# Patient Record
Sex: Female | Born: 1940 | Race: White | Hispanic: No | Marital: Single | Smoking: Former smoker
Health system: Southern US, Community
[De-identification: ages and names within clinical notes are randomized; demographics above are authoritative.]

## PROBLEM LIST (undated history)

## (undated) DIAGNOSIS — M1990 Unspecified osteoarthritis, unspecified site: Secondary | ICD-10-CM

## (undated) DIAGNOSIS — Z8679 Personal history of other diseases of the circulatory system: Secondary | ICD-10-CM

## (undated) DIAGNOSIS — E0590 Thyrotoxicosis, unspecified without thyrotoxic crisis or storm: Secondary | ICD-10-CM

## (undated) DIAGNOSIS — E119 Type 2 diabetes mellitus without complications: Secondary | ICD-10-CM

## (undated) HISTORY — DX: Thyrotoxicosis, unspecified without thyrotoxic crisis or storm: E05.90

## (undated) HISTORY — PX: OTHER SURGICAL HISTORY: SHX169

## (undated) HISTORY — DX: Type 2 diabetes mellitus without complications: E11.9

## (undated) HISTORY — DX: Personal history of other diseases of the circulatory system: Z86.79

---

## 2005-03-21 DIAGNOSIS — C4491 Basal cell carcinoma of skin, unspecified: Secondary | ICD-10-CM

## 2005-03-21 HISTORY — DX: Basal cell carcinoma of skin, unspecified: C44.91

## 2011-12-18 ENCOUNTER — Encounter (INDEPENDENT_AMBULATORY_CARE_PROVIDER_SITE_OTHER): Payer: Self-pay | Admitting: Internal Medicine

## 2011-12-18 ENCOUNTER — Encounter (INDEPENDENT_AMBULATORY_CARE_PROVIDER_SITE_OTHER): Payer: Self-pay

## 2011-12-18 ENCOUNTER — Telehealth (INDEPENDENT_AMBULATORY_CARE_PROVIDER_SITE_OTHER): Payer: Self-pay | Admitting: *Deleted

## 2011-12-18 ENCOUNTER — Other Ambulatory Visit (INDEPENDENT_AMBULATORY_CARE_PROVIDER_SITE_OTHER): Payer: Self-pay | Admitting: *Deleted

## 2011-12-18 ENCOUNTER — Ambulatory Visit (INDEPENDENT_AMBULATORY_CARE_PROVIDER_SITE_OTHER): Payer: Medicare Other | Admitting: Internal Medicine

## 2011-12-18 VITALS — BP 132/62 | HR 72 | Temp 97.9°F | Ht 67.0 in | Wt 129.5 lb

## 2011-12-18 DIAGNOSIS — K625 Hemorrhage of anus and rectum: Secondary | ICD-10-CM

## 2011-12-18 MED ORDER — PEG-KCL-NACL-NASULF-NA ASC-C 100 G PO SOLR: 1.0000 | Freq: Once | ORAL | Status: DC

## 2011-12-18 NOTE — Progress Notes (Signed)
Subjective:     Patient ID: Erika Holland, female   DOB: Oct 10, 1940, 71 y.o.   MRN: [id number]  HPI Erika Holland is a 71 yr old female referred to our office by Dr. Charm Barges for rectal bleeding.  During her health screen.  She had 2 stool cards were positive. Appetite is good. No weight loss. No acid reflux. NO belly pain.  She usually has a BM about 2-3 a day. No melena or bright red rectal bleeding. She denies any GI problems.  One aunt had colon cancer in her 10s. Her last colonoscopy was 12/16/2007: Diverticula in the sigmoid colon, otherwise normal.    10/2011 H and H 14.8 and 43.9 MCV 91. Review of Systems see hpi Current Outpatient Prescriptions  Medication Sig Dispense Refill  . naproxen sodium (ANAPROX) 220 MG tablet Take 220 mg by mouth as needed.       History reviewed. No pertinent past medical history. Past Surgical History  Procedure Date  . Bandade surgery     ovarian cyst   History   Social History  . Marital Status: Single    Spouse Name: N/A    Number of Children: N/A  . Years of Education: N/A   Occupational History  . Not on file.   Social History Main Topics  . Smoking status: Never Smoker   . Smokeless tobacco: Not on file  . Alcohol Use: Yes     one glass wine daily  . Drug Use: No  . Sexually Active: Not on file   Other Topics Concern  . Not on file   Social History Narrative  . No narrative on file   Family Status  Relation Status Death Age  . Mother Deceased     old age age 69  . Father Deceased     Work accident  . Sister Alive     good health  . Brother Alive     good health.        Objective:   Physical Exam Filed Vitals:   12/18/11 1112  Height: 5\' 7"  (1.702 m)  Weight: 129 lb 8 oz (58.741 kg)   Alert and oriented. Skin warm and dry. Oral mucosa is moist.   . Sclera anicteric, conjunctivae is pink. Thyroid not enlarged. No cervical lymphadenopathy. Lungs clear. Heart regular rate and rhythm.  Abdomen is soft. Bowel sounds are  positive. No hepatomegaly. No abdominal masses felt. No tenderness.  No edema to lower extremities. Patient is alert and oriented.  Stool gluaic negative.     Assessment:    Rectal bleeding. AVM, colon carcinoma needs to be ruled out,. * Plan:   Colonoscopy with Dr. Karilyn Cota

## 2011-12-18 NOTE — Telephone Encounter (Signed)
Patient needs movi prep 

## 2011-12-18 NOTE — Patient Instructions (Signed)
Rectal Bleeding  Rectal bleeding is when blood passes out of the anus. It is usually a sign that something is wrong. It may not be serious, but it should always be evaluated. Rectal bleeding may present as bright red blood or extremely dark stools. The color may range from dark red or maroon to black (like tar). It is important that the cause of rectal bleeding be identified so treatment can be started and the problem corrected.  CAUSES    Hemorrhoids. These are enlarged (dilated) blood vessels or veins in the anal or rectal area.   Fistulas. Theseare abnormal, burrowing channels that usually run from inside the rectum to the skin around the anus. They can bleed.   Anal fissures. This is a tear in the tissue of the anus. Bleeding occurs with bowel movements.   Diverticulosis. This is a condition in which pockets or sacs project from the bowel wall. Occasionally, the sacs can bleed.   Diverticulitis. Thisis an infection involving diverticulosis of the colon.   Proctitis and colitis. These are conditions in which the rectum, colon, or both, can become inflamed and pitted (ulcerated).   Polyps and cancer. Polyps are non-cancerous (benign) growths in the colon that may bleed. Certain types of polyps turn into cancer.   Protrusion of the rectum. Part of the rectum can project from the anus and bleed.   Certain medicines.   Intestinal infections.   Blood vessel abnormalities.  HOME CARE INSTRUCTIONS   Eat a high-fiber diet to keep your stool soft.   Limit activity.   Drink enough fluids to keep your urine clear or pale yellow.   Warm baths may be useful to soothe rectal pain.   Follow up with your caregiver as directed.  SEEK IMMEDIATE MEDICAL CARE IF:   You develop increased bleeding.   You have black or dark red stools.   You vomit blood or material that looks like coffee grounds.   You have abdominal pain or tenderness.   You have a fever.   You feel weak, nauseous, or you faint.   You have  severe rectal pain or you are unable to have a bowel movement.  MAKE SURE YOU:   Understand these instructions.   Will watch your condition.   Will get help right away if you are not doing well or get worse.  Document Released: 02/07/2002 Document Revised: 08/07/2011 Document Reviewed: 02/02/2011  [employer] Patient Information 2012 [employer].

## 2011-12-24 ENCOUNTER — Encounter (INDEPENDENT_AMBULATORY_CARE_PROVIDER_SITE_OTHER): Payer: Self-pay

## 2011-12-24 MED ORDER — SODIUM CHLORIDE 0.45 % IV SOLN
Freq: Once | INTRAVENOUS | Status: AC
  Administered 2011-12-25: 1000 mL via INTRAVENOUS

## 2011-12-25 ENCOUNTER — Encounter (HOSPITAL_COMMUNITY): Payer: Self-pay | Admitting: *Deleted

## 2011-12-25 ENCOUNTER — Encounter (HOSPITAL_COMMUNITY)
Admission: RE | Disposition: A | Discharge status: Home / Self Care | Payer: Self-pay | Source: Ambulatory Visit | Attending: Internal Medicine

## 2011-12-25 ENCOUNTER — Ambulatory Visit (HOSPITAL_COMMUNITY)
Admission: RE | Admit: 2011-12-25 | Discharge: 2011-12-25 | Disposition: A | Discharge status: Home / Self Care | Payer: Medicare Other | Source: Ambulatory Visit | Attending: Internal Medicine | Admitting: Internal Medicine

## 2011-12-25 DIAGNOSIS — K644 Residual hemorrhoidal skin tags: Secondary | ICD-10-CM

## 2011-12-25 DIAGNOSIS — K922 Gastrointestinal hemorrhage, unspecified: Secondary | ICD-10-CM

## 2011-12-25 DIAGNOSIS — K5730 Diverticulosis of large intestine without perforation or abscess without bleeding: Secondary | ICD-10-CM

## 2011-12-25 DIAGNOSIS — D126 Benign neoplasm of colon, unspecified: Secondary | ICD-10-CM | POA: Insufficient documentation

## 2011-12-25 DIAGNOSIS — K625 Hemorrhage of anus and rectum: Secondary | ICD-10-CM

## 2011-12-25 DIAGNOSIS — K921 Melena: Secondary | ICD-10-CM | POA: Insufficient documentation

## 2011-12-25 HISTORY — PX: COLONOSCOPY: SHX5424

## 2011-12-25 SURGERY — COLONOSCOPY
Anesthesia: Moderate Sedation

## 2011-12-25 MED ORDER — STERILE WATER FOR IRRIGATION IR SOLN
Status: DC | PRN
  Administered 2011-12-25: 07:00:00

## 2011-12-25 MED ORDER — MIDAZOLAM HCL 5 MG/5ML IJ SOLN
INTRAMUSCULAR | Status: AC
  Filled 2011-12-25: qty 10

## 2011-12-25 MED ORDER — MEPERIDINE HCL 50 MG/ML IJ SOLN
INTRAMUSCULAR | Status: AC
  Filled 2011-12-25: qty 1

## 2011-12-25 MED ORDER — MIDAZOLAM HCL 5 MG/5ML IJ SOLN
INTRAMUSCULAR | Status: DC | PRN
  Administered 2011-12-25: 1 mg via INTRAVENOUS
  Administered 2011-12-25 (×2): 2 mg via INTRAVENOUS
  Administered 2011-12-25 (×2): 1 mg via INTRAVENOUS

## 2011-12-25 MED ORDER — MEPERIDINE HCL 50 MG/ML IJ SOLN
INTRAMUSCULAR | Status: DC | PRN
  Administered 2011-12-25 (×2): 25 mg via INTRAVENOUS

## 2011-12-25 NOTE — Op Note (Signed)
COLONOSCOPY PROCEDURE REPORT  PATIENT:  Erika Holland  MR#:  [id number] Birthdate:  [birth year], 71 y.o., female Endoscopist:  Dr. Malissa Hippo, MD Referred By:  Dr. Samuel Jester, D.O. Procedure Date: 12/25/2011  Procedure:   Colonoscopy  Indications:  Patient is 71 year old Caucasian female was noted to have heme-positive stools. Her last colonoscopy was about 4 years ago for diarrhea and reportedly was normal. She is undergoing diagnostic colonoscopy.  Informed Consent:  The procedure and risks were reviewed with the patient and informed consent was obtained.  Medications:  Demerol 50 mg IV Versed 7 mg IV  Description of procedure:  After a digital rectal exam was performed, that colonoscope was advanced from the anus through the rectum and colon to the area of the cecum, ileocecal valve and appendiceal orifice. The cecum was deeply intubated. These structures were well-seen and photographed for the record. From the level of the cecum and ileocecal valve, the scope was slowly and cautiously withdrawn. The mucosal surfaces were carefully surveyed utilizing scope tip to flexion to facilitate fold flattening as needed. The scope was pulled down into the rectum where a thorough exam including retroflexion was performed.  Findings:   Prep excellent. Few scattered diverticula at sigmoid colon. Small polyp ablated via cold biopsy from rectosigmoid  junction. Small hemorrhoids below the dentate line.  Therapeutic/Diagnostic Maneuvers Performed:  see above  Complications:  None  Cecal Withdrawal Time:  9 minutes  Impression:  Examination performed to cecum. Sigmoid colon diverticulosis(mild). Small polyp ablated via cold biopsy from rectosigmoid junction. Small external hemorrhoids.  Recommendations:  Standard instructions given. I will be contacting patient with results of biopsy. Patient rice to have H&H in 3 months and use Aleve on an as needed basis as she is already  doing  [name] U  12/25/2011 8:23 AM  CC: Dr. Samuel Jester, DO, DO & Dr. No ref. provider found

## 2011-12-25 NOTE — Discharge Instructions (Signed)
Resume usual medications. High fiber diet. No driving for 24 hours. Physician Will contact you with biopsy results.Colon Polyps A polyp is extra tissue that grows inside your body. Colon polyps grow in the large intestine. The large intestine, also called the colon, is part of your digestive system. It is a long, hollow tube at the end of your digestive tract where your body makes and stores stool. Most polyps are not dangerous. They are benign. This means they are not cancerous. But over time, some types of polyps can turn into cancer. Polyps that are smaller than a pea are usually not harmful. But larger polyps could someday become or may already be cancerous. To be safe, doctors remove all polyps and test them.  WHO GETS POLYPS? Anyone can get polyps, but certain people are more likely than others. You may have a greater chance of getting polyps if:  You are over 50.   You have had polyps before.   Someone in your family has had polyps.   Someone in your family has had cancer of the large intestine.   Find out if someone in your family has had polyps. You may also be more likely to get polyps if you:   Eat a lot of fatty foods.   Smoke.   Drink alcohol.   Do not exercise.   Eat too much.  SYMPTOMS  Most small polyps do not cause symptoms. People often do not know they have one until their caregiver finds it during a regular checkup or while testing them for something else. Some people do have symptoms like these:  Bleeding from the anus. You might notice blood on your underwear or on toilet paper after you have had a bowel movement.   Constipation or diarrhea that lasts more than a week.   Blood in the stool. Blood can make stool look black or it can show up as red streaks in the stool.  If you have any of these symptoms, see your caregiver. HOW DOES THE DOCTOR TEST FOR POLYPS? The doctor can use four tests to check for polyps:  Digital rectal exam. The caregiver wears  gloves and checks your rectum (the last part of the large intestine) to see if it feels normal. This test would find polyps only in the rectum. Your caregiver may need to do one of the other tests listed below to find polyps higher up in the intestine.   Barium enema. The caregiver puts a liquid called barium into your rectum before taking x-rays of your large intestine. Barium makes your intestine look white in the pictures. Polyps are dark, so they are easy to see.   Sigmoidoscopy. With this test, the caregiver can see inside your large intestine. A thin flexible tube is placed into your rectum. The device is called a sigmoidoscope, which has a light and a tiny video camera in it. The caregiver uses the sigmoidoscope to look at the last third of your large intestine.   Colonoscopy. This test is like sigmoidoscopy, but the caregiver looks at all of the large intestine. It usually requires sedation. This is the most common method for finding and removing polyps.  TREATMENT   The caregiver will remove the polyp during sigmoidoscopy or colonoscopy. The polyp is then tested for cancer.   If you have had polyps, your caregiver may want you to get tested regularly in the future.  PREVENTION  There is not one sure way to prevent polyps. You might be able to  lower your risk of getting them if you:  Eat more fruits and vegetables and less fatty food.   Do not smoke.   Avoid alcohol.   Exercise every day.   Lose weight if you are overweight.   Eating more calcium and folate can also lower your risk of getting polyps. Some foods that are rich in calcium are milk, cheese, and broccoli. Some foods that are rich in folate are chickpeas, kidney beans, and spinach.   Aspirin might help prevent polyps. Studies are under way.  Document Released: 05/14/2004 Document Revised: 08/07/2011 Document Reviewed: 10/20/2007 [facility] Patient Information 2012 [place], [place].

## 2011-12-25 NOTE — H&P (Signed)
Erika Holland is an 71 y.o. female.   Chief Complaint: patient is here for colonoscopy. HPI: Patient was recently noted to have heme positive stools. She denies abdominal pain, rectal bleeding or diarrhea. Maternal aunt had CRC in her 46,s   History reviewed. No pertinent past medical history.  Past Surgical History  Procedure Date  . Bandade surgery     ovarian cyst    History reviewed. No pertinent family history. Social History:  reports that she has never smoked. She does not have any smokeless tobacco history on file. She reports that she drinks alcohol. She reports that she does not use illicit drugs.  Allergies:  Allergies  Allergen Reactions  . Penicillins     rash    Medications Prior to Admission  Medication Sig Dispense Refill  . naproxen sodium (ANAPROX) 220 MG tablet Take 220 mg by mouth as needed.      . peg 3350 powder (MOVIPREP) SOLR Take 1 kit (100 g total) by mouth once.  1 kit  0    No results found for this or any previous visit (from the past 48 hour(s)). No results found.  Review of Systems  Constitutional: Negative for weight loss.  Gastrointestinal: Negative for abdominal pain, diarrhea, constipation, blood in stool and melena.    Blood pressure 136/71, pulse 66, temperature 97.5 F (36.4 C), temperature source Axillary, resp. rate 15, SpO2 100.00%. Physical Exam  Constitutional: She appears well-developed and well-nourished.  HENT:  Mouth/Throat: Oropharynx is clear and moist.  Eyes: Conjunctivae are normal. No scleral icterus.  Neck: No thyromegaly present.  Cardiovascular: Normal rate, regular rhythm and normal heart sounds.   No murmur heard. Respiratory: Effort normal and breath sounds normal.  GI: Soft. She exhibits no distension and no mass. There is no tenderness.  Musculoskeletal: She exhibits no edema.  Lymphadenopathy:    She has no cervical adenopathy.  Neurological: She is alert.  Skin: Skin is warm and dry.      Assessment/Plan Heme positive stools. Colonoscopy.  Erika Holland U 12/25/2011, 7:38 AM

## 2011-12-29 ENCOUNTER — Encounter (HOSPITAL_COMMUNITY): Payer: Self-pay | Admitting: Internal Medicine

## 2012-01-02 ENCOUNTER — Encounter (INDEPENDENT_AMBULATORY_CARE_PROVIDER_SITE_OTHER): Payer: Self-pay | Admitting: *Deleted

## 2012-01-12 ENCOUNTER — Encounter (INDEPENDENT_AMBULATORY_CARE_PROVIDER_SITE_OTHER): Payer: Self-pay

## 2012-05-19 ENCOUNTER — Encounter (HOSPITAL_COMMUNITY)
Admission: RE | Admit: 2012-05-19 | Discharge: 2012-05-19 | Disposition: A | Discharge status: Home / Self Care | Payer: Medicare Other | Source: Ambulatory Visit | Attending: Podiatry | Admitting: Podiatry

## 2012-05-19 ENCOUNTER — Encounter (HOSPITAL_COMMUNITY): Payer: Self-pay

## 2012-05-19 ENCOUNTER — Encounter (HOSPITAL_COMMUNITY): Payer: Self-pay | Admitting: Pharmacy Technician

## 2012-05-19 HISTORY — DX: Unspecified osteoarthritis, unspecified site: M19.90

## 2012-05-19 LAB — BASIC METABOLIC PANEL
GFR calc Af Amer: >90 mL/min (ref >90)
GFR calc non Af Amer: >90 mL/min (ref >90)
Potassium: 4 mEq/L (ref 3.5–5.1)
Sodium: 139 mEq/L (ref 135–145)

## 2012-05-19 LAB — SURGICAL PCR SCREEN: MRSA, PCR: NEGATIVE

## 2012-05-19 LAB — HEMOGLOBIN AND HEMATOCRIT, BLOOD: HCT: 39.5 % (ref 36.0–46.0)

## 2012-05-19 NOTE — Patient Instructions (Signed)
20 Erika Holland  05/19/2012   Your procedure is scheduled on:  05/27/12  Report to [facility] at 06:15 AM.  Call this number if you have problems the morning of surgery: [phone]   Remember:   Do not eat food or drink:After Midnight.  Take these medicines the morning of surgery with A SIP OF WATER: None   Do not wear jewelry, make-up or nail polish.  Do not wear lotions, powders, or perfumes.  Do not shave 48 hours prior to surgery. Men may shave face and neck.  Do not bring valuables to the hospital.  Contacts, dentures or bridgework may not be worn into surgery.  Leave suitcase in the car. After surgery it may be brought to your room.  For patients admitted to the hospital, checkout time is 11:00 AM the day of discharge.   Patients discharged the day of surgery will not be allowed to drive home.  Special Instructions: CHG Shower Shower 2 days before surgery and 1 day before surgery with Hibiclens.   Please read over the following fact sheets that you were given: Pain Booklet, MRSA Information, Surgical Site Infection Prevention, Anesthesia Post-op Instructions and Care and Recovery After Surgery    Bunionectomy A bunionectomy is surgery to remove a bunion. A bunion is an enlargement of the joint at the base of the big toe. It is made up of bone and soft tissue on the inside part of the joint. Over time, a painful lump appears on the inside of the joint. The big toe begins to point inward toward the second toe. New bone growth can occur and a bone spur may form. The pain eventually causes difficulty walking. A bunion usually results from inflammation caused by the irritation of poorly fitting shoes. It often begins later in life. A bunionectomy is performed when nonsurgical treatment no longer works. When surgery is needed, the extent of the procedure will depend on the degree of deformity of the foot. Your surgeon will discuss with you the different procedures and what will work best  for you depending on your age and health. LET YOUR CAREGIVER KNOW ABOUT:   Previous problems with anesthetics or medicines used to numb the skin.   Allergies to dyes, iodine, foods, and/or latex.   Medicines taken including herbs, eye drops, prescription medicines (especially medicines used to "thin the blood"), aspirin and other over-the-counter medicines, and steroids (by mouth or as a cream).   History of bleeding or blood problems.   Possibility of pregnancy, if this applies.   History of blood clots in your legs and/or lungs .   Previous surgery.   Other important health problems.  RISKS AND COMPLICATIONS   Infection.   Pain.   Nerve damage.   Possibility that the bunion will recur.  BEFORE THE PROCEDURE  You should be present 60 minutes prior to your procedure or as directed.  PROCEDURE  Surgery is often done so that you can go home the same day (outpatient). It may be done in a hospital or in an outpatient surgical center. An anesthetic will be used to help you sleep during the procedure. Sometimes, a spinal anesthetic is used to make you numb below the waist. A cut (incision) is made over the swollen area at the first joint of the big toe. The enlarged lump will be removed. If there is a need to reposition the bones of the big toe, this may require more than 1 incision. The bone itself may need  to be cut. Screws and wires may be used in the repair. These can be removed at a later date. In severe cases, the entire joint may need to be removed and a joint replacement inserted. When done, the incision is closed with stitches (sutures). Skin adhesive strips may be added for reinforcement. They help hold the incision closed.  AFTER THE PROCEDURE  Compression bandages (dressings) are then wrapped around the wound. This helps to keep the foot in alignment and reduce swelling. Your foot will be monitored for bleeding and swelling. You will need to stay for a few hours in the recovery  area before being discharged. This allows time for the anesthesia to wear off. You will be discharged home when you are awake, stable, and doing well. HOME CARE INSTRUCTIONS   You can expect to return to normal activities within 6 to 8 weeks after surgery. The foot is at increased risk for swelling for several months. When you can expect to bear weight on the operated foot will depend on the extent of your surgery. The milder the deformity, the less tissue is removed and the sooner the return to normal activity level. During the recovery period, a special shoe, boot, or cast may be worn to accommodate the surgical bandage and to help provide stability to the foot.   Once you are home, an ice pack applied to the operative site may help with discomfort and keep swelling down. Stop using the ice if it causes discomfort.   Keep your feet raised (elevated) when possible to lessen swelling.   If you have an elastic bandage on your foot and you have numbness, tingling, or your foot becomes cold and blue, adjust the bandage to make it comfortable.   Change dressings as directed.   Keep the wound dry and clean. The wound may be washed gently with soap and water. Gently blot dry without rubbing. Do not take baths or use swimming pools or hot tubs for 10 days, or as instructed by your caregiver.   Only take over-the-counter or prescription medicines for pain, discomfort, or fever as directed by your caregiver.   You may continue a normal diet as directed.   For activity, use crutches with no weight bearing or your orthopedic shoe as directed. Continue to use crutches or a cane as directed until you can stand without causing pain.  SEEK MEDICAL CARE IF:   You have redness, swelling, bruising, or increasing pain in the wound.   There is pus coming from the wound.   You have drainage from a wound lasting longer than 1 day.   You have an oral temperature above 102 F (38.9 C).   You notice a bad  smell coming from the wound or dressing.   The wound breaks open after sutures have been removed.   You develop dizzy episodes or fainting while standing.   You have persistent nausea or vomiting.   Your toes become cold.   Pain is not relieved with medicines.  SEEK IMMEDIATE MEDICAL CARE IF:   You develop a rash.   You have difficulty breathing.   You develop any reaction or side effects to medicines given.   Your toes are numb or blue, or you have severe pain.  MAKE SURE YOU:   Understand these instructions.   Will watch your condition.   Will get help right away if you are not doing well or get worse.  Document Released: 08/01/2005 Document Revised: 08/07/2011 Document Reviewed: 09/06/2007  [employer] Patient Information 2012 [place], [place].   PATIENT INSTRUCTIONS POST-ANESTHESIA  IMMEDIATELY FOLLOWING SURGERY:  Do not drive or operate machinery for the first twenty four hours after surgery.  Do not make any important decisions for twenty four hours after surgery or while taking narcotic pain medications or sedatives.  If you develop intractable nausea and vomiting or a severe headache please notify your doctor immediately.  FOLLOW-UP:  Please make an appointment with your surgeon as instructed. You do not need to follow up with anesthesia unless specifically instructed to do so.  WOUND CARE INSTRUCTIONS (if applicable):  Keep a dry clean dressing on the anesthesia/puncture wound site if there is drainage.  Once the wound has quit draining you may leave it open to air.  Generally you should leave the bandage intact for twenty four hours unless there is drainage.  If the epidural site drains for more than 36-48 hours please call the anesthesia department.  QUESTIONS?:  Please feel free to call your physician or the hospital operator if you have any questions, and they will be happy to assist you.

## 2012-05-27 ENCOUNTER — Encounter (HOSPITAL_COMMUNITY): Payer: Self-pay | Admitting: Anesthesiology

## 2012-05-27 ENCOUNTER — Ambulatory Visit (HOSPITAL_COMMUNITY): Payer: Medicare Other

## 2012-05-27 ENCOUNTER — Encounter (HOSPITAL_COMMUNITY)
Admission: RE | Disposition: A | Discharge status: Home / Self Care | Payer: Self-pay | Source: Ambulatory Visit | Attending: Podiatry

## 2012-05-27 ENCOUNTER — Ambulatory Visit (HOSPITAL_COMMUNITY): Payer: Medicare Other | Admitting: Anesthesiology

## 2012-05-27 ENCOUNTER — Ambulatory Visit (HOSPITAL_COMMUNITY)
Admission: RE | Admit: 2012-05-27 | Discharge: 2012-05-27 | Disposition: A | Discharge status: Home / Self Care | Payer: Medicare Other | Source: Ambulatory Visit | Attending: Podiatry | Admitting: Podiatry

## 2012-05-27 ENCOUNTER — Encounter (HOSPITAL_COMMUNITY): Payer: Self-pay | Admitting: *Deleted

## 2012-05-27 DIAGNOSIS — M205X9 Other deformities of toe(s) (acquired), unspecified foot: Secondary | ICD-10-CM

## 2012-05-27 DIAGNOSIS — Z01812 Encounter for preprocedural laboratory examination: Secondary | ICD-10-CM | POA: Insufficient documentation

## 2012-05-27 DIAGNOSIS — Z01810 Encounter for preprocedural cardiovascular examination: Secondary | ICD-10-CM | POA: Insufficient documentation

## 2012-05-27 HISTORY — PX: CHEILECTOMY: SHX1336

## 2012-05-27 SURGERY — CHEILECTOMY
Anesthesia: Monitor Anesthesia Care | Site: Foot | Laterality: Right | Wound class: Clean

## 2012-05-27 MED ORDER — CLINDAMYCIN PHOSPHATE 600 MG/50ML IV SOLN
INTRAVENOUS | Status: AC
  Filled 2012-05-27: qty 50

## 2012-05-27 MED ORDER — CLINDAMYCIN PHOSPHATE 600 MG/50ML IV SOLN: 600.0000 mg | Freq: Once | INTRAVENOUS | Status: DC

## 2012-05-27 MED ORDER — 0.9 % SODIUM CHLORIDE (POUR BTL) OPTIME
TOPICAL | Status: DC | PRN
  Administered 2012-05-27: 1000 mL

## 2012-05-27 MED ORDER — FENTANYL CITRATE 0.05 MG/ML IJ SOLN
INTRAMUSCULAR | Status: AC
  Filled 2012-05-27: qty 2

## 2012-05-27 MED ORDER — ONDANSETRON HCL 4 MG/2ML IJ SOLN: 4.0000 mg | Freq: Once | INTRAMUSCULAR | Status: DC | PRN

## 2012-05-27 MED ORDER — LACTATED RINGERS IV SOLN
INTRAVENOUS | Status: DC | PRN
  Administered 2012-05-27: 07:00:00 via INTRAVENOUS

## 2012-05-27 MED ORDER — MIDAZOLAM HCL 2 MG/2ML IJ SOLN
INTRAMUSCULAR | Status: AC
  Filled 2012-05-27: qty 2

## 2012-05-27 MED ORDER — MIDAZOLAM HCL 2 MG/2ML IJ SOLN
1.0000 mg | INTRAMUSCULAR | Status: DC | PRN
  Administered 2012-05-27: 2 mg via INTRAVENOUS

## 2012-05-27 MED ORDER — ARTIFICIAL TEARS OP OINT
TOPICAL_OINTMENT | OPHTHALMIC | Status: AC
  Filled 2012-05-27: qty 3.5

## 2012-05-27 MED ORDER — BUPIVACAINE HCL (PF) 0.5 % IJ SOLN
INTRAMUSCULAR | Status: DC | PRN
  Administered 2012-05-27: 20 mL

## 2012-05-27 MED ORDER — LIDOCAINE HCL (PF) 1 % IJ SOLN
INTRAMUSCULAR | Status: AC
  Filled 2012-05-27: qty 2

## 2012-05-27 MED ORDER — LIDOCAINE HCL (PF) 1 % IJ SOLN
INTRAMUSCULAR | Status: AC
  Filled 2012-05-27: qty 5

## 2012-05-27 MED ORDER — BUPIVACAINE HCL (PF) 0.5 % IJ SOLN
INTRAMUSCULAR | Status: AC
  Filled 2012-05-27: qty 30

## 2012-05-27 MED ORDER — PROPOFOL 10 MG/ML IV EMUL
INTRAVENOUS | Status: AC
  Filled 2012-05-27: qty 20

## 2012-05-27 MED ORDER — FENTANYL CITRATE 0.05 MG/ML IJ SOLN: 25.0000 ug | INTRAMUSCULAR | Status: DC | PRN

## 2012-05-27 MED ORDER — LACTATED RINGERS IV SOLN
INTRAVENOUS | Status: DC
  Administered 2012-05-27: 07:00:00 via INTRAVENOUS

## 2012-05-27 MED ORDER — FENTANYL CITRATE 0.05 MG/ML IJ SOLN
INTRAMUSCULAR | Status: DC | PRN
  Administered 2012-05-27: 50 ug via INTRAVENOUS
  Administered 2012-05-27 (×2): 25 ug via INTRAVENOUS

## 2012-05-27 MED ORDER — PROPOFOL INFUSION 10 MG/ML OPTIME
INTRAVENOUS | Status: DC | PRN
  Administered 2012-05-27: 75 ug/kg/min via INTRAVENOUS

## 2012-05-27 MED ORDER — MIDAZOLAM HCL 5 MG/5ML IJ SOLN
INTRAMUSCULAR | Status: DC | PRN
  Administered 2012-05-27: 2 mg via INTRAVENOUS

## 2012-05-27 MED ORDER — CLINDAMYCIN PHOSPHATE 600 MG/50ML IV SOLN
INTRAVENOUS | Status: DC | PRN
  Administered 2012-05-27: 600 mg via INTRAVENOUS

## 2012-05-27 SURGICAL SUPPLY — 48 items
BAG HAMPER (MISCELLANEOUS) ×2 IMPLANT
BANDAGE CONFORM 2  STR LF (GAUZE/BANDAGES/DRESSINGS) ×2 IMPLANT
BANDAGE ELASTIC 4 VELCRO NS (GAUZE/BANDAGES/DRESSINGS) ×2 IMPLANT
BANDAGE ESMARK 4X12 BL STRL LF (DISPOSABLE) ×1 IMPLANT
BANDAGE GAUZE ELAST BULKY 4 IN (GAUZE/BANDAGES/DRESSINGS) ×2 IMPLANT
BENZOIN TINCTURE PRP APPL 2/3 (GAUZE/BANDAGES/DRESSINGS) ×2 IMPLANT
BLADE AVERAGE 25X9 (BLADE) IMPLANT
BLADE OSC/SAGITTAL MD 9X18.5 (BLADE) ×2 IMPLANT
BLADE SURG 15 STRL LF DISP TIS (BLADE) ×1 IMPLANT
BLADE SURG 15 STRL SS (BLADE) ×1
BNDG ESMARK 4X12 BLUE STRL LF (DISPOSABLE) ×2
CHLORAPREP W/TINT 26ML (MISCELLANEOUS) ×2 IMPLANT
CLOTH BEACON ORANGE TIMEOUT ST (SAFETY) ×2 IMPLANT
COVER LIGHT HANDLE STERIS (MISCELLANEOUS) ×4 IMPLANT
CUFF TOURN SGL LL 12 (TOURNIQUET CUFF) IMPLANT
CUFF TOURNIQUET SINGLE 18IN (TOURNIQUET CUFF) ×2 IMPLANT
DECANTER SPIKE VIAL GLASS SM (MISCELLANEOUS) ×2 IMPLANT
DRAPE OEC MINIVIEW 54X84 (DRAPES) ×2 IMPLANT
DRSG ADAPTIC 3X8 NADH LF (GAUZE/BANDAGES/DRESSINGS) ×2 IMPLANT
DURA STEPPER LG (CAST SUPPLIES) IMPLANT
DURA STEPPER MED (CAST SUPPLIES) ×2 IMPLANT
DURA STEPPER SML (CAST SUPPLIES) IMPLANT
DURA STEPPER XL (SOFTGOODS) IMPLANT
ELECT REM PT RETURN 9FT ADLT (ELECTROSURGICAL) ×2
ELECTRODE REM PT RTRN 9FT ADLT (ELECTROSURGICAL) ×1 IMPLANT
GLOVE BIO SURGEON STRL SZ7.5 (GLOVE) ×2 IMPLANT
GLOVE ECLIPSE 6.5 STRL STRAW (GLOVE) ×2 IMPLANT
GLOVE INDICATOR 7.0 STRL GRN (GLOVE) ×2 IMPLANT
GOWN STRL REIN XL XLG (GOWN DISPOSABLE) ×4 IMPLANT
K-WIRE 6 (WIRE)
KIT ROOM TURNOVER APOR (KITS) ×2 IMPLANT
KWIRE 6 (WIRE) IMPLANT
KWIRE SGL PT/SMTH 9X45IN (WIRE) ×2 IMPLANT
MANIFOLD NEPTUNE II (INSTRUMENTS) ×2 IMPLANT
MARKER SKIN DUAL TIP RULER LAB (MISCELLANEOUS) ×2 IMPLANT
NEEDLE HYPO 18GX1.5 BLUNT FILL (NEEDLE) IMPLANT
NEEDLE HYPO 27GX1-1/4 (NEEDLE) ×4 IMPLANT
NS IRRIG 1000ML POUR BTL (IV SOLUTION) ×2 IMPLANT
PACK BASIC LIMB (CUSTOM PROCEDURE TRAY) ×2 IMPLANT
PAD ARMBOARD 7.5X6 YLW CONV (MISCELLANEOUS) ×2 IMPLANT
RASP SM TEAR CROSS CUT (RASP) ×2 IMPLANT
SET BASIN LINEN APH (SET/KITS/TRAYS/PACK) ×2 IMPLANT
SPONGE GAUZE 4X4 12PLY (GAUZE/BANDAGES/DRESSINGS) IMPLANT
STRIP CLOSURE SKIN 1/2X4 (GAUZE/BANDAGES/DRESSINGS) ×2 IMPLANT
SUT BONE WAX W31G (SUTURE) ×2 IMPLANT
SUT VIC AB 4-0 PS2 27 (SUTURE) ×2 IMPLANT
SUT VICRYL AB 3-0 FS1 BRD 27IN (SUTURE) ×2 IMPLANT
SYR CONTROL 10ML LL (SYRINGE) ×4 IMPLANT

## 2012-05-27 NOTE — H&P (Signed)
HISTORY AND PHYSICAL INTERVAL NOTE:  05/27/2012  7:29 AM  Erika Holland  has presented today for surgery, with the diagnosis of Hallux limitus right foot.  The various methods of treatment have been discussed with the patient.  No guarantees were given.  After consideration of risks, benefits and other options for treatment, the patient has consented to surgery.  I have reviewed the patients' chart and labs.    Patient Vitals for the past 24 hrs:  BP Temp Temp src Pulse Resp SpO2  05/27/12 0715 129/73 mmHg - - - 70  97 %  05/27/12 0700 125/101 mmHg - - - 33  100 %  05/27/12 0645 126/70 mmHg - - - 16  100 %  05/27/12 0642 126/70 mmHg 97.8 F (36.6 C) Oral 71  20  100 %    A history and physical examination was performed in my office on 05/19/2012.  The patient was reexamined.  There have been no changes to this history and physical examination.  Dallas Schimke, DPM

## 2012-05-27 NOTE — Preoperative (Signed)
Beta Blockers   Reason not to administer Beta Blockers:Not Applicable 

## 2012-05-27 NOTE — Transfer of Care (Signed)
  Anesthesia Post-op Note  Patient: Erika Holland  Procedure(s) Performed: Procedure(s) (LRB) with comments: CHEILECTOMY (Right) - Cheilectomy right foot  Patient Location: Short stay  Anesthesia Type: MAC  Level of Consciousness: awake, alert , oriented and patient cooperative  Airway and Oxygen Therapy: Patient Spontanous Breathing and Patient connected to face mask oxygen  Post-op Pain: mild  Post-op Assessment: Post-op Vital signs reviewed, Patient's Cardiovascular Status Stable, Respiratory Function Stable, Patent Airway and No signs of Nausea or vomiting  Post-op Vital Signs: Reviewed and stable  Complications: No apparent anesthesia complications

## 2012-05-27 NOTE — Anesthesia Procedure Notes (Signed)
Procedure Name: MAC Date/Time: 05/27/2012 7:38 AM Performed by: Carolyne Littles, Yahya Boldman L Pre-anesthesia Checklist: Patient identified, Timeout performed, Emergency Drugs available, Suction available and Patient being monitored Patient Re-evaluated:Patient Re-evaluated prior to inductionOxygen Delivery Method: Non-rebreather mask

## 2012-05-27 NOTE — Anesthesia Preprocedure Evaluation (Signed)
Anesthesia Evaluation  Patient identified by MRN, date of birth, ID band Patient awake    Reviewed: Allergy & Precautions, H&P , NPO status , Patient's Chart, lab work & pertinent test results  History of Anesthesia Complications Negative for: history of anesthetic complications  Airway Mallampati: II TM Distance: >3 FB     Dental  (+) Teeth Intact and Implants   Pulmonary neg pulmonary ROS,  breath sounds clear to auscultation        Cardiovascular negative cardio ROS  Rhythm:Regular     Neuro/Psych    GI/Hepatic   Endo/Other    Renal/GU      Musculoskeletal   Abdominal   Peds  Hematology   Anesthesia Other Findings   Reproductive/Obstetrics                           Anesthesia Physical Anesthesia Plan  ASA: II  Anesthesia Plan: MAC   Post-op Pain Management:    Induction: Intravenous  Airway Management Planned: Nasal Cannula  Additional Equipment:   Intra-op Plan:   Post-operative Plan:   Informed Consent: I have reviewed the patients History and Physical, chart, labs and discussed the procedure including the risks, benefits and alternatives for the proposed anesthesia with the patient or authorized representative who has indicated his/her understanding and acceptance.     Plan Discussed with:   Anesthesia Plan Comments:         Anesthesia Quick Evaluation

## 2012-05-27 NOTE — Anesthesia Postprocedure Evaluation (Signed)
  Anesthesia Post-op Note  Patient: Erika Holland  Procedure(s) Performed: Procedure(s) (LRB) with comments: CHEILECTOMY (Right) - Cheilectomy right foot  Patient Location:Short stay  Anesthesia Type: MAC  Level of Consciousness: awake, alert , oriented and patient cooperative  Airway and Oxygen Therapy: Patient Spontanous Breathing  Post-op Pain: none  Post-op Assessment: Post-op Vital signs reviewed, Patient's Cardiovascular Status Stable, Respiratory Function Stable, Patent Airway and No signs of Nausea or vomiting  Post-op Vital Signs: Reviewed and stable  Complications: No apparent anesthesia complications

## 2012-05-27 NOTE — Brief Op Note (Signed)
BRIEF OPERATIVE NOTE  SURGEON:   Dallas Schimke, DPM  OR STAFF:   Rogene Houston, RN - Circulator Sherri Sear, CST - Scrub Person Eliane Decree Page, RN - Circulator Assistant   PREOPERATIVE DIAGNOSIS:   Hallux limitus right foot  POSTOPERATIVE DIAGNOSIS: Same  PROCEDURE: Cheilectomy right foot  ANESTHESIA:  Monitor Anesthesia Care   HEMOSTASIS:   Pneumatic ankle tourniquet set at 250 mmHg  ESTIMATED BLOOD LOSS:   Minimal (<5 cc)  MATERIALS USED:  0.045" k-wire (not implanted), bone wax  INJECTABLES: Marcaine 0.5% plain; 20mL  PATHOLOGY:   None  COMPLICATIONS:   None  INDICATIONS:  Painful, limited 1st MPJ range of motion.    DICTATION:  Other Dictation: Dictation Number [id number]

## 2012-05-28 ENCOUNTER — Encounter (HOSPITAL_COMMUNITY): Payer: Self-pay | Admitting: Podiatry

## 2012-05-28 NOTE — Op Note (Signed)
NAME:  Erika Holland, Erika Holland               ACCOUNT NO.:  [id number]  MEDICAL RECORD NO.:  [id number]  LOCATION:  [facility]                          FACILITY:  [facility]  PHYSICIAN:  B. Theola Sequin, MD   DATE OF BIRTH:  [birth year]  DATE OF PROCEDURE:  05/27/2012 DATE OF DISCHARGE:  05/27/2012                              OPERATIVE REPORT   SURGEON:  B. Theola Sequin, MD  ASSISTANT:  None.  PREOPERATIVE DIAGNOSIS:  Hallux limitus, right foot.  POSTOPERATIVE DIAGNOSIS:  Hallux limitus, right foot.  PROCEDURE:  Cheilectomy, first metatarsophalangeal joint, right foot.  ANESTHESIA:  MAC with local.  HEMOSTASIS:  Pneumatic ankle tourniquet at 250 mmHg.  ESTIMATED BLOOD LOSS:  Minimal (less than 5 mL).  MATERIALS:  A 0.045-inch Kirschner wire, not implanted, bone wax.  INJECTABLE:  0.5% Marcaine plain.  PATHOLOGY:  None.  COMPLICATIONS:  None.  PROCEDURE IN DETAIL:  The patient was brought to the operating room, placed on the operative table in supine position.  Pneumatic ankle tourniquet was placed about the patient's right ankle.  The foot was anesthetized using 0.5% Marcaine plain.  The foot was scrubbed, prepped and draped in usual sterile manner.  The limb was then elevated, exsanguinated, and then the pneumatic ankle tourniquet was inflated to 250 mmHg.  Attention was directed to the dorsal aspect of the right foot where a curvilinear incision was made medial and parallel to the extensor hallucis longus tendon overlying the first metatarsophalangeal joint. Dissection was continued deep down to the level of first metatarsophalangeal joint capsule.  A linear longitudinal periosteal and capsular incisions was performed.  The periosteal and capsular structures were reflected medially and laterally, thus exposing the first metatarsal at the operative site.  Prominent dorsal exostosis of the first metatarsal head was noted.  The dorsal 50% of the articular surface was eroded.  Adaptive  changes were noted along the dorsal aspect of the base of the proximal phalanx.  Focal area of denuded cartilage was noted along the dorsal aspect of the articular surface along the base of the proximal phalanx.  Using a power bone saw, the dorsal exostosis was resected and passed from the operative field.  A rongeur was used to resect the adaptive changes along the base of the proximal phalanx.  All bone edges were smoothed with a power rasp.  Correction of the deformity was assessed under C-arm fluoroscopy and noted to be adequate.  A McGlamry elevator was then used to perform a first metatarsophalangeal joint release following the release of the plantar structures.  There was improvement in the first metatarsophalangeal joint range of motion.  The hallux was dorsiflexed to approximately 50 degrees.  Bone wax was then applied to the dorsal aspect of the first metatarsal head.  The wound was irrigated with copious amounts of sterile irrigant.  The periosteal and capsular structures were reapproximated using 3-0 Vicryl in a simple suture technique.  The subcutaneous structures were reapproximated using 4-0 Vicryl in a simple suture technique.  The skin was reapproximated using 4-0 Vicryl in a running subcuticular manner.  The incision was reinforced with Steri- Strips.  A sterile compressive dressing was applied.  The pneumatic ankle tourniquet  was deflated and a prompt hyperemic response was noted to all digits of the operative foot.          ______________________________ B. Theola Sequin, MD     BIM/MEDQ  D:  05/27/2012  T:  05/28/2012  Job:  [id number]

## 2013-08-18 ENCOUNTER — Other Ambulatory Visit: Payer: Self-pay | Admitting: Dermatology

## 2013-09-01 DIAGNOSIS — C4492 Squamous cell carcinoma of skin, unspecified: Secondary | ICD-10-CM

## 2013-09-01 HISTORY — DX: Squamous cell carcinoma of skin, unspecified: C44.92

## 2013-10-09 IMAGING — CR DG FOOT COMPLETE 3+V*R*
3 series · 3 of 3 positions shown · non-contrast
Comparison: None

CLINICAL DATA: Post cheilectomy first MTP joint

RIGHT FOOT COMPLETE - 3+ VIEW

[view not recorded (1 of 3)]
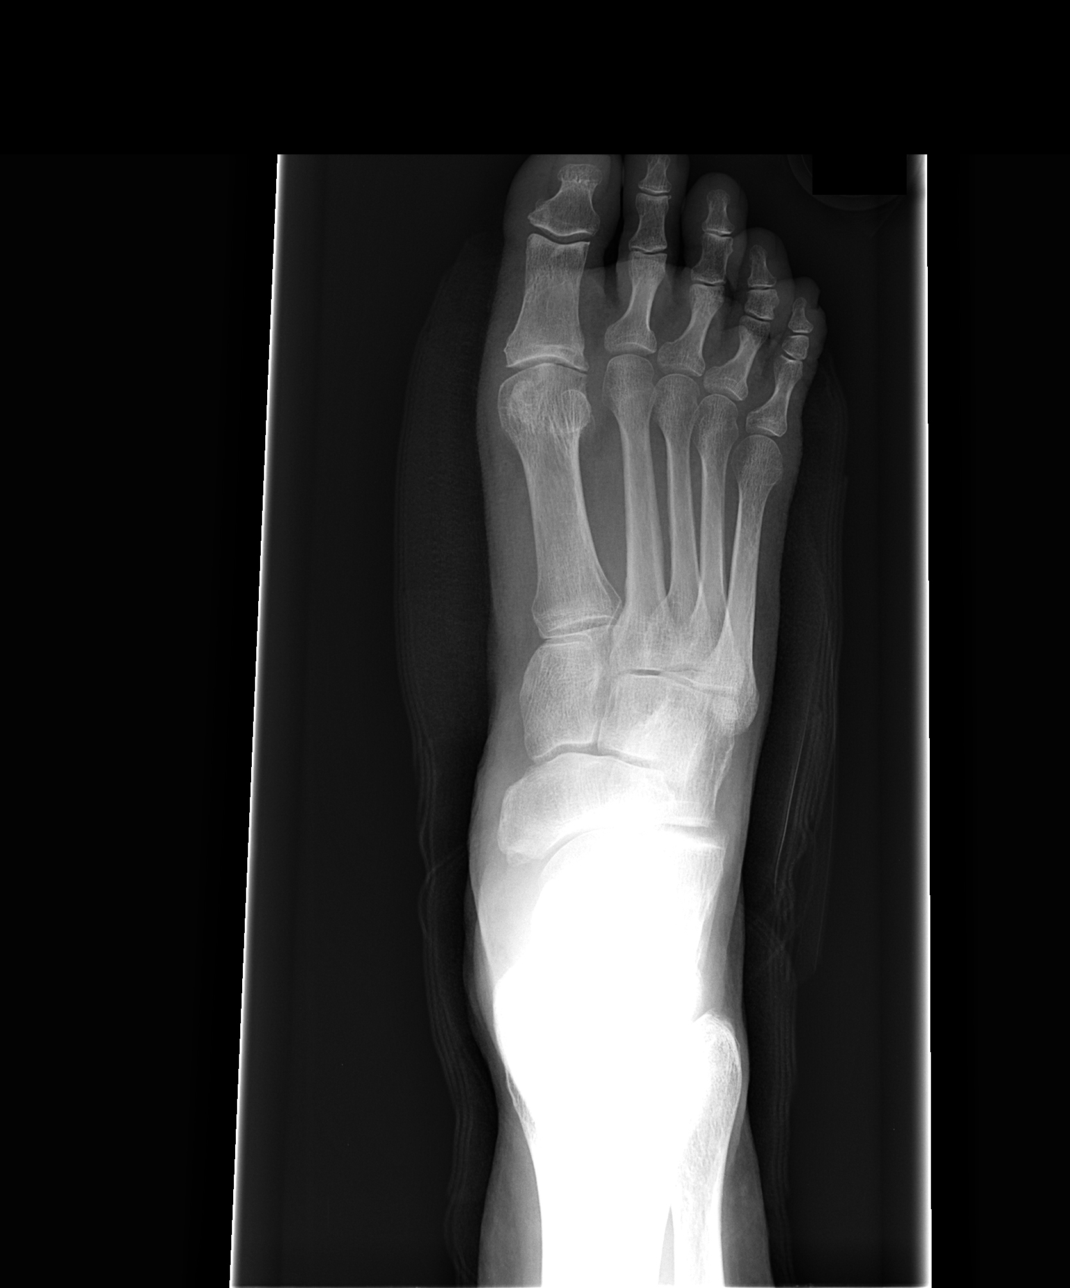

[view not recorded (2 of 3)]
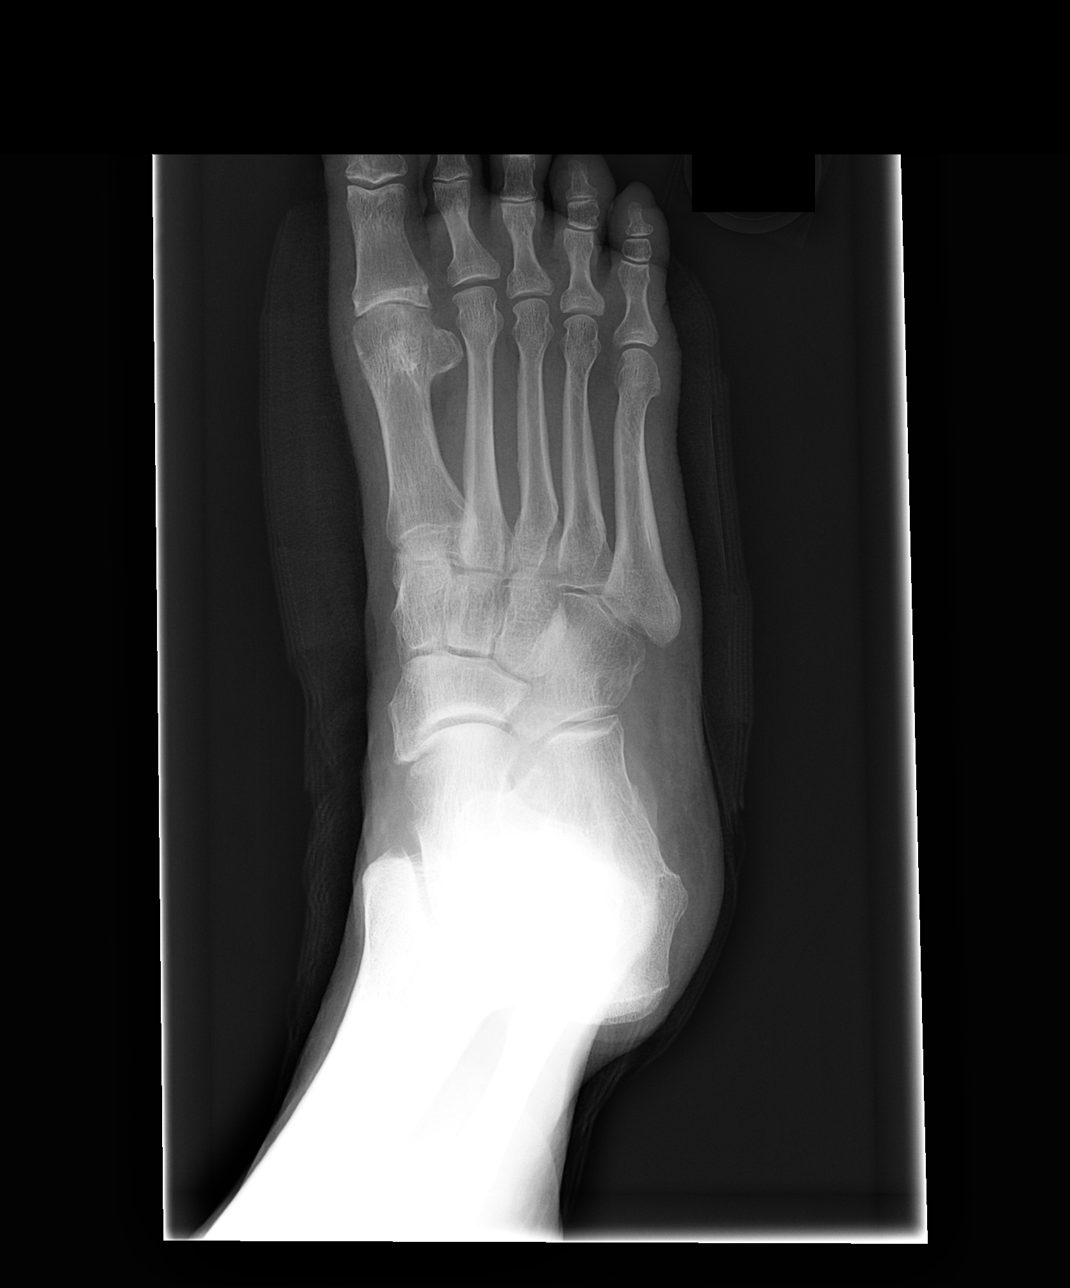

[view not recorded (3 of 3)]
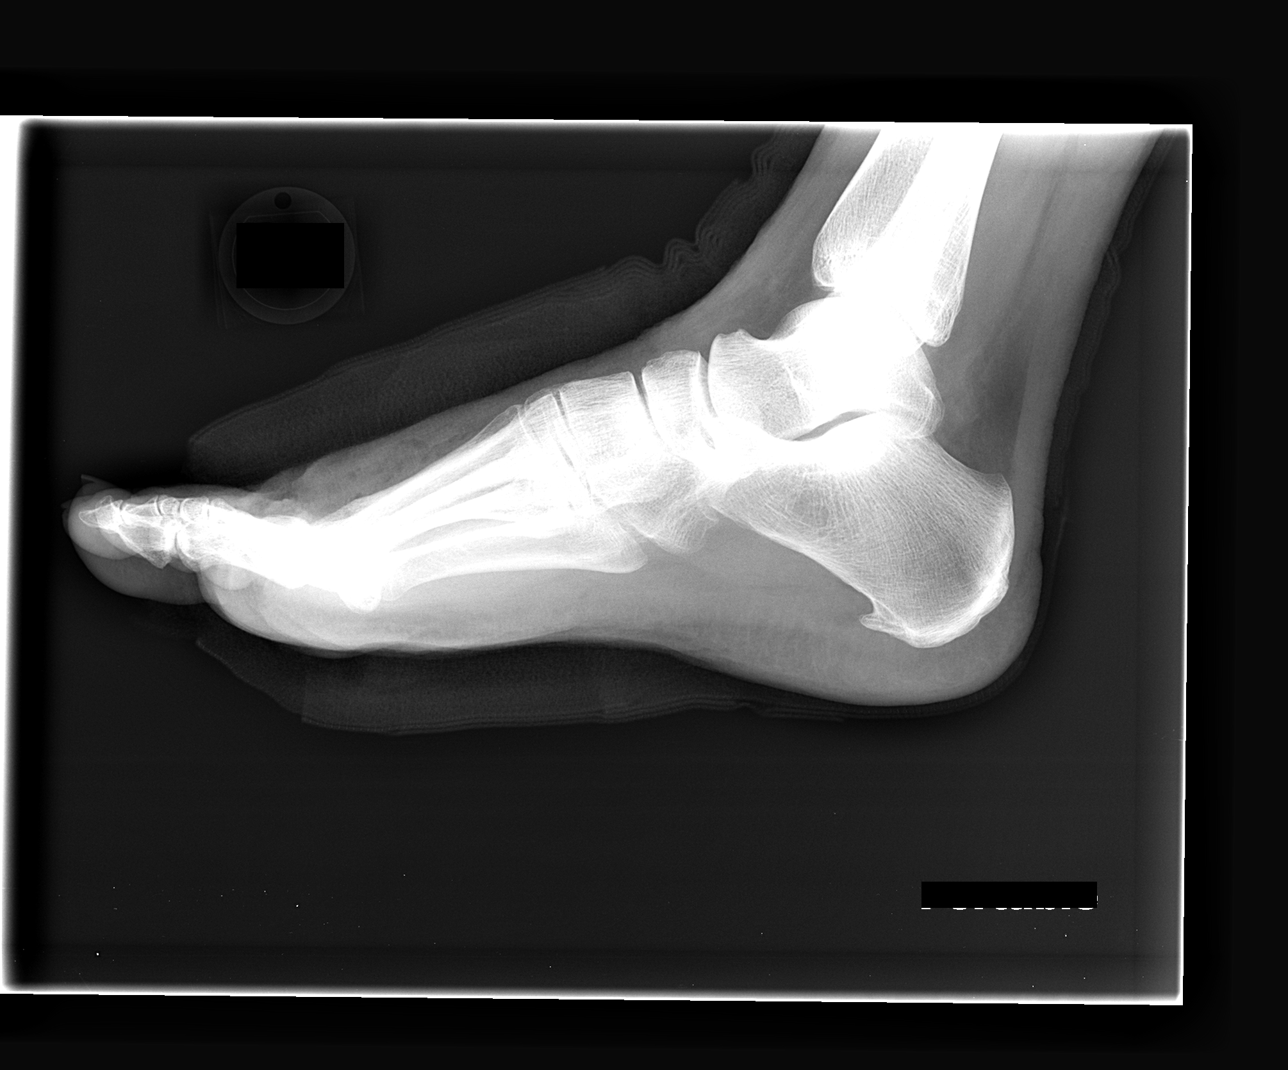

[3 of 3 positions shown; findings below may reference images not displayed]

FINDINGS: Mild osseous demineralization.
Degenerative changes first MTP joint with joint space narrowing.
Mild overlying soft tissue swelling and minimal soft tissue gas.
Post osteotomy along the dorsal margin of the first metatarsal
head.
No acute fracture, dislocation, or bone destruction.
Associated dressing artifacts.
IMPRESSION: Post osteotomy along the dorsal margin of the right first
metatarsal head.
Osseous demineralization with degenerative changes first MTP joint.

## 2014-04-11 ENCOUNTER — Encounter: Payer: Self-pay | Admitting: Neurology

## 2014-04-11 ENCOUNTER — Ambulatory Visit (INDEPENDENT_AMBULATORY_CARE_PROVIDER_SITE_OTHER): Payer: Medicare Other | Admitting: Neurology

## 2014-04-11 VITALS — BP 170/70 | HR 119 | Ht 66.0 in | Wt 126.0 lb

## 2014-04-11 DIAGNOSIS — R5381 Other malaise: Secondary | ICD-10-CM

## 2014-04-11 DIAGNOSIS — R531 Weakness: Secondary | ICD-10-CM

## 2014-04-11 DIAGNOSIS — R5383 Other fatigue: Secondary | ICD-10-CM

## 2014-04-11 LAB — C-REACTIVE PROTEIN

## 2014-04-11 LAB — VITAMIN B12: VITAMIN B 12: 815 pg/mL (ref 211–911)

## 2014-04-11 LAB — CK: Total CK: 41 U/L (ref 7–177)

## 2014-04-11 LAB — TSH

## 2014-04-11 LAB — SEDIMENTATION RATE: SED RATE: 1 mm/h (ref 0–22)

## 2014-04-11 NOTE — Progress Notes (Signed)
Addendum  MBS rec'd from [facility] dated 04/05/2014:  Mild oropharyngeal dysphagia characterized by spillover to the level of the valleculae secondary to reduced oral motor ROM.  Mild to moderate pharyngeal residue.  Patient with obvious difficulty passing dry swallows from pharynx to esophagus requiring multiple swallow and/or liquid wash.  Delayed esophageal motility.  Erika Menefee K. Posey Pronto, DO

## 2014-04-11 NOTE — Progress Notes (Signed)
Note faxed.

## 2014-04-11 NOTE — Progress Notes (Signed)
[facility] Clinic Note - Initial Visit   Date: 04/11/2014  Erika Holland MRN: [id number] DOB: Mar 13, 1941   Dear Dr. Melina Copa:  Thank you for your kind referral of Erika Holland for consultation of tremors. Although her history is well known to you, please allow [place] to reiterate it for the purpose of our medical record. The patient was accompanied to the clinic by self.   History of Present Illness: Erika Holland is a 73 y.o. right-handed Caucasian female with no prior medical history presenting for evaluation of jittery feeling and generalized weakness.    Starting in May 2015, she was at a wedding in [place] and noticed feeling very "jittery inside" located everywhere (arms, legs, torso) and generalized weakness.  She does not see any tremors.  Symptoms are worse as the day progresses.  Symptoms are better when she is sitting or at rest and worse when active. She goes to [facility] daily and had to abbreviate her work-up regimen because she was previously able to lift 35lb on weight machine, but had to reduced repetition to 10 and weight to 20lb.  She is not falling or dropping objects. She has no problems climbing stairs, but has to be more careful when going down stairs.  Around July 2015, she also developed problems with swallowing and talking.  She is not choking and denies problems with solids or liquids, but has "hard swallowing".  She has to drink fluids which helps push the food down.  She was seen by Dr. Redmond Pulling (ENT in [place]) who ordered MBS last week (no results available to review).  She sleeps on one pillow and denies any shortness of breath when laying flat.   Denies constipation or anosmia.  Mood is happy.  She endorses occasional vivid dreams and taste has changed.  No problems with sleep.  Handwriting has become "wiggly".  No sitffness, cramps, or muscle twitches.  She feels as if she is slower than before.  She had lost 12lb unintentionally over the  past few months, but says that her appetite is good.  She is reportedly up-to-date with her mammogram and colonoscopy.   She saw her PCP who ordered glucose tolerance testing which was abnormal.  Her OBGYN then started her on metformin and recommended she see endocrinology. Because of low blood sugar, her PCP recommended that she stop the metformin. There has been no change in her symptoms with the initiated of metformin or stopping it.    Past Medical History  Diagnosis Date  . Arthritis     Past Surgical History  Procedure Laterality Date  . Bandade surgery      ovarian cyst  . Colonoscopy  12/25/2011    Procedure: COLONOSCOPY;  Surgeon: Rogene Houston, MD;  Location: [facility];  Service: Endoscopy;  Laterality: N/A;  730  . Cheilectomy  05/27/2012    Procedure: CHEILECTOMY;  Surgeon: Marcheta Grammes, DPM;  Location: [facility] ORS;  Service: Orthopedics;  Laterality: Right;  Cheilectomy right foot     Medications:  Current Outpatient Prescriptions on File Prior to Visit  Medication Sig Dispense Refill  . Calcium Carbonate (CALTRATE 600 PO) Take 2 tablets by mouth daily.      . cholecalciferol (VITAMIN D) 1000 UNITS tablet Take 1,000 Units by mouth daily.      . Garlic (GARLIQUE) 295 MG TBEC Take 1 tablet by mouth daily.      . Glucosamine-Chondroitin (MOVE FREE PO) Take 1 tablet by mouth  daily.      . Multiple Vitamin (MULTIVITAMIN WITH MINERALS) TABS Take 1 tablet by mouth daily.      . Multiple Vitamins-Minerals (IPRIFLAVONE OSTEO FORMULA) CAPS Take 1 capsule by mouth daily.      . Naphazoline HCl (AK-CON OP) Apply 1 drop to eye daily.       No current facility-administered medications on file prior to visit.    Allergies:  Allergies  Allergen Reactions  . Penicillins Rash    Family History: History reviewed. No pertinent family history.  Social History: History   Social History  . Marital Status: Single    Spouse Name: N/A    Number of Children: N/A  .  Years of Education: N/A   Occupational History  . Not on file.   Social History Main Topics  . Smoking status: Former Smoker -- 0.25 packs/day for 5 years    Types: Cigarettes  . Smokeless tobacco: Former Systems developer    Quit date: 05/20/1967  . Alcohol Use: Yes     Comment: one glass wine daily  . Drug Use: No  . Sexual Activity: Yes    Birth Control/ Protection: Post-menopausal   Other Topics Concern  . Not on file   Social History Narrative  . No narrative on file    Review of Systems:  CONSTITUTIONAL: No fevers, chills, night sweats, +12 lb unintentional weight loss.   EYES: No visual changes or eye pain ENT: No hearing changes.  No history of nose bleeds.   RESPIRATORY: No cough, wheezing and shortness of breath.   CARDIOVASCULAR: Negative for chest pain, + palpitations.   GI: Negative for abdominal discomfort, blood in stools or black stools.  No recent change in bowel habits.   GU:  No history of incontinence.   MUSCLOSKELETAL: No history of joint pain or swelling.  No myalgias SKIN: Negative for lesions, rash, and itching.   HEMATOLOGY/ONCOLOGY: Negative for prolonged bleeding, bruising easily, and swollen nodes.  No history of cancer.   ENDOCRINE: Negative for cold or heat intolerance, polydipsia or goiter.   PSYCH:  No depression or anxiety symptoms.   NEURO: As Above.   Vital Signs:  BP 170/70  Pulse 119  Ht _0  (1.676 m)  Wt 126 lb (57.153 kg)  BMI 20.35 kg/m2  SpO2 97%   General Medical Exam:   General:  Thin-appearing, comfortable.   Eyes/ENT: see cranial nerve examination.  Sunken cheeks and mild temporal wasting. Neck: No masses appreciated.  Full range of motion without tenderness.  No carotid bruits. Respiratory:  Clear to auscultation, good air entry bilaterally.  She is able to count to 24 on deep inhalation. Cardiac:  Regular rate and rhythm, no murmur.    Extremities:  No deformities, edema, or skin discoloration. Good capillary refill.   Skin:   Skin color, texture, turgor normal. No rashes or lesions.  Neurological Exam: MENTAL STATUS including orientation to time, place, person, recent and remote memory, attention span and concentration, language, and fund of knowledge is normal.  Speech is not dysarthric.  Lingual and gutteral sounds are normal.  CRANIAL NERVES: II:  No visual field defects.  Unremarkable fundi.   III-IV-VI: Pupils equal round and reactive to light.  Normal conjugate, extra-ocular eye movements in all directions of gaze.  No nystagmus.  Subtle left ptosis (old).   V:  Normal facial sensation.  Jaw jerk is absent.   VII:  Normal facial symmetry and movements. Facial muscles are intact. Mild buccinator weakness.  No pathologic facial reflexes.  VIII:  Normal hearing and vestibular function.   IX-X:  Normal palatal movement.   XI:  Normal shoulder shrug and head rotation.   XII:  Normal tongue strength and range of motion, no deviation or fasciculation.  MOTOR:  Generalized muscle atrophy, especially in forearms. No fasciculations or abnormal movements.  No pronator drift.  Tone is normal.  No fatigability on muscle testing. Neck flexion and extension is 5/5  Right Upper Extremity:    Left Upper Extremity:    Deltoid  5/5   Deltoid  5/5   Biceps  5/5   Biceps  5/5   Triceps  5/5   Triceps  5/5   Wrist extensors  5/5   Wrist extensors  5/5   Wrist flexors  5/5   Wrist flexors  5/5   Finger extensors  5/5   Finger extensors  5/5   Finger flexors  5-/5   Finger flexors  5-/5   Dorsal interossei  5/5   Dorsal interossei  5/5   Abductor pollicis  5/5   Abductor pollicis  5/5   Tone (Ashworth scale)  0  Tone (Ashworth scale)  0   Right Lower Extremity:    Left Lower Extremity:    Hip flexors  4+/5   Hip flexors  4+/5   Hip extensors  4+/5   Hip extensors  4+/5   Knee flexors  4+/5   Knee flexors  4+/5   Knee extensors  5/5   Knee extensors  5/5   Dorsiflexors  5/5   Dorsiflexors  5/5   Plantarflexors  5-/5    Plantarflexors  5-/5   Toe extensors  5/5   Toe extensors  5/5   Toe flexors  5-/5   Toe flexors  5-/5   Tone (Ashworth scale)  0  Tone (Ashworth scale)  0   MSRs:  Right                                                                 Left brachioradialis 2+  brachioradialis 2+  biceps 2+  biceps 2+  triceps 2+  triceps 2+  patellar 2+  patellar 2+  ankle jerk 0  ankle jerk 0  Hoffman no  Hoffman no  plantar response down  plantar response down   SENSORY:  Normal and symmetric perception of light touch, pinprick, vibration, and proprioception.  Romberg's sign absent.   COORDINATION/GAIT: Normal finger-to- nose-finger and heel-to-shin.  Intact rapid alternating movements bilaterally.  Able to rise from a chair without using arms.  Gait narrow based and stable. Tandem and stressed gait intact. She is able to squat and raise up unassisted.  Data: Labs 02/06/2014:  3hr glucose tolerance test:  fasting 110*, 1hr 207*, 2 hr 134*, 3 hr 57   IMPRESSION: Ms. Guadiana is a 73 year-old female presenting for evaluation of sensation of internal "jitteriness" and weakness.  Her exam shows leg weakness affecting bilateral hip flexion, hip extension, knee flexion, and toe flexors. Finger flexion is also mildly weak. Reflexes are absent at the ankles.  Sensation is preserved.   There is no evidence of bradykinesia, tremor, rigidity, or gait instability.    I will check labs for myopathy, autoimmune disease, myasthenia gravis,nutritional deficiency,  and paraproteinemias.  Electrodiagnostic testing will be ordered to determine if there is underlying myopathic vs neuropathic process.  Consideration include vitamin deficiency, inclusion body myositis based on the distribution of her weakness and dysphagia, other possibilities include motor neuron disease, but there is no fasciculations or upper motor neuron findings. Parkinsonism or PSP is less likely based on her exam alone.    Regarding her "jittery"  sensation.  I cannot appreciate orthostatic tremor, which can manifest with similar symptoms.  I do recommend that she gets a cardiac screening for arrhythmias because her heart rate is tachycardic at today's visit.   PLAN/RECOMMENDATIONS:  1.  Check CK, aldolase, vitamin B12, TSH, SPEP/UPEP with IFE, copper, ESR, CRP, ANA, ENA, MG panel 2.  EMG of the left arm and leg 3.  Request results of MBS from [facility] 4.  Instructed patient to follow-up with PCP for possible EKG and/or holter monitor for ?arrythmias as her heart rate is tacyhcardic 5.  Return to clinic in 2 months.   The duration of this appointment visit was 60 minutes of face-to-face time with the patient.  Greater than 50% of this time was spent in counseling, explanation of diagnosis, planning of further management, and coordination of care.   Thank you for allowing me to participate in patient's care.  If I can answer any additional questions, I would be pleased to do so.    Sincerely,    Bladen Umar K. Posey Pronto, DO

## 2014-04-11 NOTE — Patient Instructions (Addendum)
1.  Your heart rate and blood pressure was elevated today, please monitor at home and follow-up with your primary care physician, if he remains high.  You may need an EKG or heart monitor to look for arrhythmias. 2.  Check blood work 3.  NCS/EMG of the right arm and leg  4.  Return clinic in 59-months    [place] (EMG/NCS) INSTRUCTIONS  How to Prepare The neurologist conducting the EMG will need to know if you have certain medical conditions. Tell the neurologist and other EMG lab personnel if you:   Have a pacemaker or any other electrical medical device   Take blood-thinning medications   Have hemophilia, a blood-clotting disorder that causes prolonged bleeding Bathing Take a shower or bath shortly before your exam in order to remove oils from your skin. Don't apply lotions or creams before the exam.  What to Expect You'll likely be asked to change into a hospital gown for the procedure and lie down on an examination table. The following explanations can help you understand what will happen during the exam.    Electrodes. The neurologist or a technician places surface electrodes at various locations on your skin depending on where you're experiencing symptoms. Or the neurologist may insert needle electrodes at different sites depending on your symptoms.    Sensations. The electrodes will at times transmit a tiny electrical current that you may feel as a twinge or spasm. The needle electrode may cause discomfort or pain that usually ends shortly after the needle is removed. If you are concerned about discomfort or pain, you may want to talk to the neurologist about taking a short break during the exam.    Instructions. During the needle EMG, the neurologist will assess whether there is any spontaneous electrical activity when the muscle is at rest - activity that isn't present in healthy muscle tissue - and the degree of activity when you slightly contract the  muscle.  He or she will give you instructions on resting and contracting a muscle at appropriate times. Depending on what muscles and nerves the neurologist is examining, he or she may ask you to change positions during the exam.  After your EMG You may experience some temporary, minor bruising where the needle electrode was inserted into your muscle. This bruising should fade within several days. If it persists, contact your primary care doctor.

## 2014-04-12 LAB — ENA 9 PANEL
CENTROMERE AB SCREEN: NEGATIVE
ENA SM Ab Ser-aCnc: <1
JO-1 ANTIBODY, IGG: NEGATIVE
RIBOSOMAL P PROTEIN AB: NEGATIVE
SCLERODERMA (SCL-70) (ENA) ANTIBODY, IGG: NEGATIVE
SM/RNP: <1
SSA (Ro) (ENA) Antibody, IgG: <1
SSB (La) (ENA) Antibody, IgG: <1

## 2014-04-12 LAB — ANA: ANA: NEGATIVE

## 2014-04-13 LAB — UIFE/LIGHT CHAINS/TP QN, 24-HR UR
ALBUMIN, U: DETECTED
FREE KAPPA LT CHAINS, UR: 0.55 mg/dL (ref 0.14–2.42)
Free Kappa/Lambda Ratio: 11 ratio — ABNORMAL HIGH (ref 2.04–10.37)
Free Lambda Lt Chains,Ur: 0.05 mg/dL (ref 0.02–0.67)
Total Protein, Urine: 1.1 mg/dL

## 2014-04-13 LAB — COPPER, SERUM: COPPER: 110 ug/dL (ref 70–175)

## 2014-04-13 LAB — ALDOLASE: ALDOLASE: 9.1 U/L — AB (ref <=8.1)

## 2014-04-14 LAB — SPEP & IFE WITH QIG
ALBUMIN ELP: 56.8 % (ref 55.8–66.1)
ALPHA-1-GLOBULIN: 5 % — AB (ref 2.9–4.9)
Alpha-2-Globulin: 11.7 % (ref 7.1–11.8)
BETA 2: 7.3 % — AB (ref 3.2–6.5)
BETA GLOBULIN: 6.7 % (ref 4.7–7.2)
GAMMA GLOBULIN: 12.5 % (ref 11.1–18.8)
IGM, SERUM: 77 mg/dL (ref 52–322)
IgA: 369 mg/dL (ref 69–380)
IgG (Immunoglobin G), Serum: 747 mg/dL (ref 690–1700)
Total Protein, Serum Electrophoresis: 6.4 g/dL (ref 6.0–8.3)

## 2014-04-18 LAB — MYASTHENIA GRAVIS PANEL 2
ACETYLCHOLINE REC MOD AB: 15 %
Aceytlcholine Rec Bloc Ab: <15 % of inhibition (ref <15)

## 2014-04-19 ENCOUNTER — Telehealth: Payer: Self-pay | Admitting: Neurology

## 2014-04-19 NOTE — Telephone Encounter (Signed)
Called and discuss results of her lab testing with patient which shows normal CK, vitamin B12, SPEP/UPEP with IFE, copper, ESR, CRP, ANA, ENA, MG panel.  Aldolase is mildly elevated and likely nonspecific. Her TSH is markedly reduced, it is possible symptoms may be due to hyperthyroidism, and she is scheduled to see a new endocrinologist at the end of this month.    We will keep her EMG scheduled for October, if her endocrinology evaluation is negative, we will her appointment to be sooner.  Solly Derasmo K. Posey Pronto, DO

## 2014-05-09 ENCOUNTER — Telehealth: Payer: Self-pay | Admitting: Neurology

## 2014-05-09 NOTE — Telephone Encounter (Signed)
Patient said that she had labs done at Dr. Almetta Lovely office but she forgot to give them our fax #.  I called and left Lattie Haw a message to please fax results.

## 2014-05-09 NOTE — Telephone Encounter (Signed)
Please call pt, she has info following Endocrinology appt. This will determine if she needs to go forward with EMG/NCV. CB# [phone] / Sherri S.

## 2014-05-10 ENCOUNTER — Telehealth: Payer: Self-pay | Admitting: Neurology

## 2014-05-10 NOTE — Telephone Encounter (Signed)
Pt called and spoke w/ Caryl Pina to cancel her EMG on 06/19/14. She stated she felt fine and did not need to be seen. She will call when she needs to be seen.

## 2014-05-10 NOTE — Telephone Encounter (Signed)
Labs are on your desk.  

## 2014-05-10 NOTE — Telephone Encounter (Signed)
Labs rec'd from [facility] dated 03/30/2014  TSH 0.03 (normal 0.34 - 5.6) and free T4 2.91 (normal 0.61-1.12)  Her thyroid levels are abnormal and may be contributing to her weakness.  If her symptoms do not improve to worsen, despite correcting her thyroid levels, I would recommend EMG.  Donika K. Posey Pronto, DO

## 2014-05-10 NOTE — Telephone Encounter (Signed)
Patient given information but says that she is feeling so much better.  She will call back to reschedule if she starts to feel better.

## 2014-05-12 ENCOUNTER — Ambulatory Visit: Payer: BC Managed Care – PPO | Admitting: Neurology

## 2014-05-26 ENCOUNTER — Telehealth: Payer: Self-pay | Admitting: *Deleted

## 2014-05-26 NOTE — Telephone Encounter (Signed)
Patient cancelled the follow up appointment.  She said she is feeling so good now.  She asked for me to tell you thank you.

## 2014-05-26 NOTE — Telephone Encounter (Signed)
Not if she is feeling better.  Rilee Wendling K. Posey Pronto, DO

## 2014-05-26 NOTE — Telephone Encounter (Signed)
Patient called stating that Dr. Posey Pronto cancelled her EMG.  Does she need to keep follow up appointment?

## 2014-06-05 ENCOUNTER — Encounter: Payer: Medicare Other | Attending: Endocrinology | Admitting: Nutrition

## 2014-06-05 ENCOUNTER — Encounter: Payer: Self-pay | Admitting: Nutrition

## 2014-06-05 VITALS — Ht 66.0 in | Wt 122.8 lb

## 2014-06-05 DIAGNOSIS — Z713 Dietary counseling and surveillance: Secondary | ICD-10-CM | POA: Insufficient documentation

## 2014-06-05 DIAGNOSIS — E119 Type 2 diabetes mellitus without complications: Secondary | ICD-10-CM | POA: Insufficient documentation

## 2014-06-05 NOTE — Patient Instructions (Signed)
Plan:  Aim for 3 Carb Choices per meal (45 grams) +/- 1 either way  Aim for 0-1Carbs per snack if hungry  Include protein in moderation with your meals and snacks Consider reading food labels for Total Carbohydrate  Continue your activity level 60 minutes daily as tolerated Consider checking BG at alternate times per day as directed by MD  Gain 1/2 lb per week.

## 2014-06-05 NOTE — Progress Notes (Signed)
  Medical Nutrition Therapy:  Appt start time: 1300 end time:  1400.   Assessment:  Primary concerns today: Prediabetes A1C was 5.8% per pt. LIves by herself. Does own shopping and cooking. Cooks mostly baked and broiled but sometimes fried foods. Goes to the [facility] 6 days a week. Does aerobic and yoga. Has lost 15 lbs in the last 4-5 months. Recently diagnosed with AFib and has hypothyroid .  Has been monitoring blood sugars; FBS never below 80 but 80-90's.  During the day it never went above 120 mg/d'.  Preferred Learning Style:  Visual  Learning Readiness:  Not ready Contemplating Ready Change in progress  MEDICATIONS: none   DIETARY INTAKE:  24-hr recall:  B ( AM): egg sandwich or flat bread sandwich, coffee  Snk ( AM): whole wheat muffin  L ( PM): Chicken, asain salad, potatoes,water Snk ( PM): cheese/crackers water D ( PM): Veggie burger with potatoes on ww bread, water Snk ( PM): fruirt or crackers/cheese, Beverages: water  Usual physical activity: working out 6 days a week at [employer].  Estimated energy needs: 1500-1800 calories 170-200 g carbohydrates 135 g protein 50 g fat  Progress Towards Goal(s):  In progress.   Nutritional Diagnosis:  Food and Nutrition knowledge deficit related to not meeting with a dietitian before and having Prediabetes.  As evidenced by A1C 5.8%..    Intervention:  Nutrition counselingand diabetes education. Discussed portion sizes, meal planning, carb counting and My Plate.  Plan:  Aim for 3 Carb Choices per meal (45 grams) +/- 1 either way  Aim for 0-1 Carbs per snack if hungry with protein. Include protein in moderation with your meals and snacks Consider reading food labels for Total Carbohydrate  Continue  Exercising 1 hr a day 6 days a week. Consider checking BG 2 hrs after largest meal of the day a few times a month. Goal is less than 180 mg/dl or lower. Goal: Gain 1/2 lb per week. 2. Keep A1C 5.8% or lower.  Teaching Method  Utilized:  Visual Auditory Hands on  Handouts given during visit include:       My Plate Carb Counting and Food Label handouts Meal Plan Card  Barriers to learning/adherence to lifestyle change: none  Demonstrated degree of understanding via:  Teach Back   Monitoring/Evaluation:  Dietary intake, exercise, meal planning, SBG and food journal, and body weight in 1 month(s).

## 2014-06-19 ENCOUNTER — Encounter: Payer: BC Managed Care – PPO | Admitting: Neurology

## 2014-06-23 ENCOUNTER — Ambulatory Visit: Payer: BC Managed Care – PPO | Admitting: Neurology

## 2014-07-10 ENCOUNTER — Encounter: Payer: Medicare Other | Attending: Endocrinology | Admitting: Nutrition

## 2014-07-10 VITALS — Ht 66.0 in | Wt 130.6 lb

## 2014-07-10 DIAGNOSIS — Z713 Dietary counseling and surveillance: Secondary | ICD-10-CM | POA: Insufficient documentation

## 2014-07-10 DIAGNOSIS — E119 Type 2 diabetes mellitus without complications: Secondary | ICD-10-CM | POA: Insufficient documentation

## 2014-07-10 DIAGNOSIS — R7303 Prediabetes: Secondary | ICD-10-CM

## 2014-07-10 NOTE — Patient Instructions (Signed)
Plan:  Continue eating 3 Carb Choices per meal (45 grams) +/- 1 either way  Aim for 0-1 Carbs per snack if hungry with protein. Include protein in moderation with your meals and snacks Consider reading food labels for Total Carbohydrate  Continue  Exercising 1 hr a day 6 days a week.  Goal: Maintain current weight. 2. Keep A1C 5.8% or lower  Call if you have any questions [phone].

## 2014-07-10 NOTE — Progress Notes (Signed)
  Medical Nutrition Therapy:  Appt start time:  1330 end time:  9735.  Assessment:  Primary concerns today: Prediabetes  Saw Dr. Posey Pronto last week and she bs much better. A1C 5.8%. She was taken off the [facility] and reduced Toprol due to heart issues the past. GoIng to the [facility] every day but Sundays. Gained 8 lbs since last visit. She notes MD says her thyroid is doing much better.  BS: FBS: 80-102 mg/dl. 2 hrs 112-130's mg/dl.  Preferred Learning Style:  Visual  Learning Readiness:  Not ready Contemplating Ready Change in progress  MEDICATIONS: none   DIETARY INTAKE:  24-hr recall:  B ( AM): cereal and cheese toast and coffee, Snk ( AM): egg and english muffin, water L ( PM): meat, vegetables, fruit, water Snk ( PM): cheese/crackers water D ( PM): Veggie burger with potatoes on ww bread, water Snk ( PM): fruit or crackers/cheese, Beverages: water  Usual physical activity: working out 6 days a week at [employer].  Estimated energy needs: 1500-1800 calories 170-200 g carbohydrates 135 g protein 50 g fat  Progress Towards Goal(s):  In progress.   Nutritional Diagnosis:  Food and Nutrition knowledge deficit related to not meeting with a dietitian before and having Prediabetes.  As evidenced by A1C 5.8%..    Intervention:  Nutrition counselingand diabetes education. Discussed portion sizes, meal planning, carb counting and My Plate.  Plan:  Continue eating 3 Carb Choices per meal (45 grams) +/- 1 either way  Aim for 0-1 Carbs per snack if hungry with protein. Include protein in moderation with your meals and snacks Consider reading food labels for Total Carbohydrate  Continue  Exercising 1 hr a day 6 days a week.  Goal: Maintain current weight. 2. Keep A1C 5.8% or lower.  Teaching Method Utilized:  Visual Auditory Hands on  Handouts given during visit include:       My Plate Carb Counting and Food Label handouts Meal Plan Card  Barriers to learning/adherence to  lifestyle change: none  Demonstrated degree of understanding via:  Teach Back   Monitoring/Evaluation:  Dietary intake, exercise, meal planning, SBG and food journal, and body weight PRN.

## 2014-08-29 ENCOUNTER — Other Ambulatory Visit: Payer: Self-pay | Admitting: Dermatology

## 2016-04-15 ENCOUNTER — Encounter: Payer: Self-pay | Admitting: *Deleted

## 2016-04-15 NOTE — Progress Notes (Signed)
Cardiology Office Note  Date: 04/16/2016   ID: [name], DOB [birth year], MRN [id number]  PCP: Octavio Graves, DO  Evaluating Cardiologist: Rozann Lesches, MD   Chief Complaint  Patient presents with  . History of atrial fibrillation    History of Present Illness: Erika Holland is a 75 y.o. female presenting to establish cardiology follow-up. She is a former patient of Dr. Hamilton Capri with the [facility] cardiology practice, last seen in 2015 with history of atrial fibrillation that occurred in the setting of hyperthyroidism. She has had no documented episodes of atrial fibrillation or symptoms of palpitations since that time.  CHADSVASC score is 3. She was on Xarelto briefly after her original diagnosis but taken off and placed on aspirin. She is also on Toprol-XL 25 mg daily.  She exercises 6 days a week at the [facility], feels well, NYHA class I dyspnea, no exertional chest pain or palpitations.  Past Medical History:  Diagnosis Date  . Arthritis   . History of atrial fibrillation    In setting of hyperthyroidism 2015  . Hyperthyroidism   . Type 2 diabetes mellitus ([place])     Past Surgical History:  Procedure Laterality Date  . Bandade surgery     ovarian cyst  . CHEILECTOMY  05/27/2012   Procedure: CHEILECTOMY;  Surgeon: Marcheta Grammes, DPM;  Location: [facility];  Service: Orthopedics;  Laterality: Right;  Cheilectomy right foot  . COLONOSCOPY  12/25/2011   Procedure: COLONOSCOPY;  Surgeon: Rogene Houston, MD;  Location: [facility];  Service: Endoscopy;  Laterality: N/A;  730    Current Outpatient Prescriptions  Medication Sig Dispense Refill  . Acetaminophen (TYLENOL ARTHRITIS PAIN PO) Take by mouth.    Marland Kitchen aspirin 81 MG tablet Take 81 mg by mouth daily.    . Calcium Carbonate (CALTRATE 600 PO) Take 2 tablets by mouth daily.    . cholecalciferol (VITAMIN D) 1000 UNITS tablet Take 1,000 Units by mouth daily.    . Garlic (GARLIQUE) [id number] MG TBEC Take 1 tablet by  mouth daily.    . Glucosamine-Chondroitin (MOVE FREE PO) Take 1 tablet by mouth daily.    Marland Kitchen levothyroxine (SYNTHROID, LEVOTHROID) 75 MCG tablet Take 75 mcg by mouth daily before breakfast.    . metoprolol succinate (TOPROL-XL) 25 MG 24 hr tablet Take 25 mg by mouth daily.    . Multiple Vitamin (MULTIVITAMIN WITH MINERALS) TABS Take 1 tablet by mouth daily.    . Multiple Vitamins-Minerals (IPRIFLAVONE OSTEO FORMULA) CAPS Take 1 capsule by mouth daily.    . Naphazoline HCl (AK-CON OP) Apply 1 drop to eye daily.     No current facility-administered medications for this visit.    Allergies:  Penicillins   Social History: The patient  reports that she has quit smoking. Her smoking use included Cigarettes. She has a 1.25 pack-year smoking history. She quit smokeless tobacco use about 48 years ago. She reports that she drinks alcohol. She reports that she does not use drugs.   Family History: The patient's family history includes Diabetes in her mother; Healthy in her sister; Heart attack in her brother; Other in her father.   ROS:  Please see the history of present illness. Otherwise, complete review of systems is positive for none.  All other systems are reviewed and negative.   Physical Exam: VS:  BP 114/70   Pulse 68   Ht 5\' 6"  (1.676 m)   Wt 133 lb (60.3 kg)   SpO2 98%  BMI 21.47 kg/m , BMI Body mass index is 21.47 kg/m.  Wt Readings from Last 3 Encounters:  04/16/16 133 lb (60.3 kg)  07/10/14 130 lb 9.6 oz (59.2 kg)  06/05/14 122 lb 12.8 oz (55.7 kg)    General: Thin woman, appears comfortable at rest. HEENT: Conjunctiva and lids normal, oropharynx clear. Neck: Supple, no elevated JVP or carotid bruits, no thyromegaly. Lungs: Clear to auscultation, nonlabored breathing at rest. Cardiac: Regular rate and rhythm, no S3 or significant systolic murmur, no pericardial rub. Abdomen: Soft, nontender, bowel sounds present, no guarding or rebound. Extremities: No pitting edema, distal  pulses 2+. Skin: Warm and dry. Musculoskeletal: No kyphosis. Neuropsychiatric: Alert and oriented x3, affect grossly appropriate.  ECG: I personally reviewed the tracing from 10/09/2015 which showed normal sinus rhythm with decreased R wave progression and nonspecific T-wave changes.  Other Studies Reviewed Today:  Echocardiogram 04/26/2014 [facility]): LVEF 60-65%, mild left atrial enlargement, mildly sclerotic aortic valve, RVSP 53 mmHg, no pericardial effusion.  Assessment and Plan:  1. History of atrial fibrillation documented back in 2015 in the setting of hyperthyroidism. She has had no recurrences. CHADSVASC score is 3. At this point would recommend continuing aspirin and low-dose beta blocker with plan for observation. If she has further episodes, then would go back to anticoagulation long-term.  2. History of hyperthyroidism status post treatment and now on low-dose Synthroid. She follows with Dr. Melina Copa.  3. Type 2 diabetes mellitus, managed by diet.  Current medicines were reviewed with the patient today.  Disposition: Follow-up with me in one year.  Signed, Satira Sark, MD, [facility] 04/16/2016 10:36 AM    [place] at [place], [place], [place] [address] Phone: [phone]; Fax: [phone]

## 2016-04-16 ENCOUNTER — Encounter (INDEPENDENT_AMBULATORY_CARE_PROVIDER_SITE_OTHER): Payer: Self-pay

## 2016-04-16 ENCOUNTER — Encounter: Payer: Self-pay | Admitting: Cardiology

## 2016-04-16 ENCOUNTER — Ambulatory Visit (INDEPENDENT_AMBULATORY_CARE_PROVIDER_SITE_OTHER): Payer: Medicare HMO | Admitting: Cardiology

## 2016-04-16 VITALS — BP 114/70 | HR 68 | Ht 66.0 in | Wt 133.0 lb

## 2016-04-16 DIAGNOSIS — Z8679 Personal history of other diseases of the circulatory system: Secondary | ICD-10-CM | POA: Diagnosis not present

## 2016-04-16 DIAGNOSIS — E119 Type 2 diabetes mellitus without complications: Secondary | ICD-10-CM

## 2016-04-16 DIAGNOSIS — Z8639 Personal history of other endocrine, nutritional and metabolic disease: Secondary | ICD-10-CM

## 2016-04-16 NOTE — Patient Instructions (Signed)

## 2016-12-15 ENCOUNTER — Other Ambulatory Visit: Payer: Self-pay

## 2016-12-15 MED ORDER — METOPROLOL SUCCINATE ER 25 MG PO TB24: 25.0000 mg | ORAL_TABLET | Freq: Every day | ORAL | 3 refills | Status: DC

## 2017-04-15 NOTE — Progress Notes (Signed)
Cardiology Office Note  Date: 04/16/2017   ID: Erika Holland, DOB September 09, 1940, MRN [id number]  PCP: Octavio Graves, DO  Primary Cardiologist: Rozann Lesches, MD   Chief Complaint  Patient presents with  . Cardiac follow-up    History of Present Illness: Erika Holland is a 76 y.o. female that I met in August 2017. She presents today for a routine follow-up visit. Remains very active, exercises 6 days a week at the [facility] and reports no angina or unusual shortness of breath. She has had no prolonged palpitations, might notice a "skip" when she is still. She continues on Toprol-XL and aspirin.  I personally reviewed her ECG today which shows sinus rhythm with PAC and nonspecific T-wave changes.  Past Medical History:  Diagnosis Date  . Arthritis   . History of atrial fibrillation    In setting of hyperthyroidism 2015  . Hyperthyroidism   . Type 2 diabetes mellitus ([place])     Past Surgical History:  Procedure Laterality Date  . Bandade surgery     ovarian cyst  . CHEILECTOMY  05/27/2012   Procedure: CHEILECTOMY;  Surgeon: Marcheta Grammes, DPM;  Location: [facility];  Service: Orthopedics;  Laterality: Right;  Cheilectomy right foot  . COLONOSCOPY  12/25/2011   Procedure: COLONOSCOPY;  Surgeon: Rogene Houston, MD;  Location: [facility];  Service: Endoscopy;  Laterality: N/A;  730    Current Outpatient Prescriptions  Medication Sig Dispense Refill  . Acetaminophen (TYLENOL ARTHRITIS PAIN PO) Take by mouth as needed.     Marland Kitchen aspirin 81 MG tablet Take 81 mg by mouth daily.    . Calcium Carbonate (CALTRATE 600 PO) Take 1 tablet by mouth daily.     . cholecalciferol (VITAMIN D) 1000 UNITS tablet Take 1,000 Units by mouth daily.    . Garlic (GARLIQUE) 916 MG TBEC Take 1 tablet by mouth daily.    . Glucosamine-Chondroitin (MOVE FREE PO) Take 1 tablet by mouth daily.    Marland Kitchen levothyroxine (SYNTHROID, LEVOTHROID) 75 MCG tablet Take 75 mcg by mouth daily before breakfast.    .  metoprolol succinate (TOPROL-XL) 25 MG 24 hr tablet Take 1 tablet (25 mg total) by mouth daily. 30 tablet 3  . Multiple Vitamin (MULTIVITAMIN WITH MINERALS) TABS Take 1 tablet by mouth daily.    . Multiple Vitamins-Minerals (IPRIFLAVONE OSTEO FORMULA) CAPS Take 1 capsule by mouth daily.    . Naphazoline HCl (AK-CON OP) Apply 1 drop to eye daily.     No current facility-administered medications for this visit.    Allergies:  Penicillins   Social History: The patient  reports that she quit smoking about 50 years ago. Her smoking use included Cigarettes. She has a 1.25 pack-year smoking history. She quit smokeless tobacco use about 49 years ago. She reports that she drinks alcohol. She reports that she does not use drugs.   ROS:  Please see the history of present illness. Otherwise, complete review of systems is positive for recent dental implant.  All other systems are reviewed and negative.   Physical Exam: VS:  BP 114/62   Pulse 67   Ht 5' 6"  (1.676 m)   Wt 131 lb (59.4 kg)   SpO2 99%   BMI 21.14 kg/m , BMI Body mass index is 21.14 kg/m.  Wt Readings from Last 3 Encounters:  04/16/17 131 lb (59.4 kg)  04/16/16 133 lb (60.3 kg)  07/10/14 130 lb 9.6 oz (59.2 kg)    General: Thin  woman, appears comfortable at rest. HEENT: Conjunctiva and lids normal, oropharynx clear. Neck: Supple, no elevated JVP or carotid bruits, no thyromegaly. Lungs: Clear to auscultation, nonlabored breathing at rest. Cardiac: Regular rate and rhythm, no S3 or significant systolic murmur, no pericardial rub. Abdomen: Soft, nontender, bowel sounds present, no guarding or rebound. Extremities: No pitting edema, distal pulses 2+.  ECG: I personally reviewed the tracing from 10/09/2015 which showed normal sinus rhythm with decreased R wave progression and nonspecific T-wave changes.  Other Studies Reviewed Today:  Echocardiogram 04/26/2014 [facility]): LVEF 60-65%, mild left atrial enlargement, mildly sclerotic  aortic valve, RVSP 53 mmHg, no pericardial effusion.  Assessment and Plan:  1. History of atrial fibrillation documented in 2015 in the setting of hyperthyroidism. She has a CHADSVASC score is 3. She has had no subsequent recurrences and plan is to continue aspirin and beta blocker for now. Anticoagulation can be reconsidered if she manifests recurrent arrhythmias.  2. History of hyperthyroidism status post ablation and now on Synthroid. She follows lab work with Dr. Melina Copa.  Current medicines were reviewed with the patient today.   Orders Placed This Encounter  Procedures  . EKG 12-Lead    Disposition: Follow-up in one year.  Signed, Satira Sark, MD, [facility] 04/16/2017 8:48 AM    [place] at [place], [place], [place] [address] Phone: [phone]; Fax: [phone]

## 2017-04-16 ENCOUNTER — Ambulatory Visit (INDEPENDENT_AMBULATORY_CARE_PROVIDER_SITE_OTHER): Payer: Medicare HMO | Admitting: Cardiology

## 2017-04-16 ENCOUNTER — Ambulatory Visit: Payer: Medicare HMO | Admitting: Cardiology

## 2017-04-16 ENCOUNTER — Encounter: Payer: Self-pay | Admitting: Cardiology

## 2017-04-16 VITALS — BP 114/62 | HR 67 | Ht 66.0 in | Wt 131.0 lb

## 2017-04-16 DIAGNOSIS — Z8639 Personal history of other endocrine, nutritional and metabolic disease: Secondary | ICD-10-CM

## 2017-04-16 DIAGNOSIS — Z8679 Personal history of other diseases of the circulatory system: Secondary | ICD-10-CM

## 2017-04-16 MED ORDER — METOPROLOL SUCCINATE ER 25 MG PO TB24: 25.0000 mg | ORAL_TABLET | Freq: Every day | ORAL | 3 refills | Status: DC

## 2017-04-16 NOTE — Patient Instructions (Signed)

## 2017-12-15 ENCOUNTER — Encounter (INDEPENDENT_AMBULATORY_CARE_PROVIDER_SITE_OTHER): Payer: Medicare HMO | Admitting: Ophthalmology

## 2017-12-15 DIAGNOSIS — H2513 Age-related nuclear cataract, bilateral: Secondary | ICD-10-CM | POA: Diagnosis not present

## 2017-12-15 DIAGNOSIS — H43813 Vitreous degeneration, bilateral: Secondary | ICD-10-CM | POA: Diagnosis not present

## 2017-12-15 DIAGNOSIS — I10 Essential (primary) hypertension: Secondary | ICD-10-CM | POA: Diagnosis not present

## 2017-12-15 DIAGNOSIS — H35033 Hypertensive retinopathy, bilateral: Secondary | ICD-10-CM

## 2017-12-15 DIAGNOSIS — H3562 Retinal hemorrhage, left eye: Secondary | ICD-10-CM | POA: Diagnosis not present

## 2018-02-10 ENCOUNTER — Encounter (INDEPENDENT_AMBULATORY_CARE_PROVIDER_SITE_OTHER): Payer: Medicare HMO | Admitting: Ophthalmology

## 2018-02-10 DIAGNOSIS — H35371 Puckering of macula, right eye: Secondary | ICD-10-CM

## 2018-02-10 DIAGNOSIS — H35033 Hypertensive retinopathy, bilateral: Secondary | ICD-10-CM | POA: Diagnosis not present

## 2018-02-10 DIAGNOSIS — I10 Essential (primary) hypertension: Secondary | ICD-10-CM

## 2018-02-10 DIAGNOSIS — H43813 Vitreous degeneration, bilateral: Secondary | ICD-10-CM

## 2018-02-10 DIAGNOSIS — H2513 Age-related nuclear cataract, bilateral: Secondary | ICD-10-CM

## 2018-04-19 NOTE — Progress Notes (Signed)
Cardiology Office Note  Date: 04/20/2018   ID: NORITA Holland, DOB Jul 17, 1941, MRN [id number]  PCP: Octavio Graves, DO  Primary Cardiologist: Rozann Lesches, MD   Chief Complaint  Patient presents with  . Cardiac follow-up    History of Present Illness: Erika Holland is a 77 y.o. female last seen in August 2018.  She is here for a routine follow-up visit.  States that she is doing well, continues to exercise every day at the [facility], reports no exertional chest pain or palpitations.  She had recent cataract surgery.  We went over her medications, she continues on low-dose aspirin and Toprol-XL.  I personally reviewed her ECG today which shows sinus rhythm with nonspecific T wave changes.  Past Medical History:  Diagnosis Date  . Arthritis   . History of atrial fibrillation    In setting of hyperthyroidism 2015  . Hyperthyroidism   . Type 2 diabetes mellitus ([place])     Past Surgical History:  Procedure Laterality Date  . Bandade surgery     ovarian cyst  . CHEILECTOMY  05/27/2012   Procedure: CHEILECTOMY;  Surgeon: Marcheta Grammes, DPM;  Location: [facility];  Service: Orthopedics;  Laterality: Right;  Cheilectomy right foot  . COLONOSCOPY  12/25/2011   Procedure: COLONOSCOPY;  Surgeon: Rogene Houston, MD;  Location: [facility];  Service: Endoscopy;  Laterality: N/A;  730    Current Outpatient Medications  Medication Sig Dispense Refill  . Acetaminophen (TYLENOL ARTHRITIS PAIN PO) Take by mouth as needed.     Marland Kitchen aspirin 81 MG tablet Take 81 mg by mouth daily.    . Calcium Carbonate (CALTRATE 600 PO) Take 1 tablet by mouth daily.     . cholecalciferol (VITAMIN D) 1000 UNITS tablet Take 1,000 Units by mouth daily.    . Garlic (GARLIQUE) 509 MG TBEC Take 1 tablet by mouth daily.    . Glucosamine-Chondroitin (MOVE FREE PO) Take 1 tablet by mouth daily.    Marland Kitchen levothyroxine (SYNTHROID, LEVOTHROID) 75 MCG tablet Take 75 mcg by mouth daily before breakfast.    .  metoprolol succinate (TOPROL-XL) 25 MG 24 hr tablet Take 1 tablet (25 mg total) by mouth daily. 90 tablet 3  . Multiple Vitamin (MULTIVITAMIN WITH MINERALS) TABS Take 1 tablet by mouth daily.    . Multiple Vitamins-Minerals (IPRIFLAVONE OSTEO FORMULA) CAPS Take 1 capsule by mouth daily.    . Naphazoline HCl (AK-CON OP) Apply 1 drop to eye daily.     No current facility-administered medications for this visit.    Allergies:  Penicillins   Social History: The patient  reports that she quit smoking about 51 years ago. Her smoking use included cigarettes. She has a 1.25 pack-year smoking history. She quit smokeless tobacco use about 50 years ago. She reports that she drinks alcohol. She reports that she does not use drugs.   ROS:  Please see the history of present illness. Otherwise, complete review of systems is positive for none.  All other systems are reviewed and negative.   Physical Exam: VS:  BP 122/68   Pulse 74   Ht 5\' 6"  (1.676 m)   Wt 130 lb (59 kg)   SpO2 98%   BMI 20.98 kg/m , BMI Body mass index is 20.98 kg/m.  Wt Readings from Last 3 Encounters:  04/20/18 130 lb (59 kg)  04/16/17 131 lb (59.4 kg)  04/16/16 133 lb (60.3 kg)    General: Patient appears comfortable at rest.  HEENT: Conjunctiva and lids normal, oropharynx clear. Neck: Supple, no elevated JVP or carotid bruits, no thyromegaly. Lungs: Clear to auscultation, nonlabored breathing at rest. Cardiac: Regular rate and rhythm, no S3 or significant systolic murmur. Abdomen: Soft, nontender, bowel sounds present. Extremities: No pitting edema, distal pulses 2+.  ECG: I personally reviewed the tracing from 04/16/2017 which showed sinus rhythm with PAC and nonspecific T wave changes.  Other Studies Reviewed Today:  Echocardiogram 04/26/2014 [facility]): LVEF 60-65%, mild left atrial enlargement, mildly sclerotic aortic valve, RVSP 53 mmHg, no pericardial effusion.  Assessment and Plan:  1.  History of atrial  fibrillation as of 2015 in the setting of hyperthyroidism. CHADSVASC score is 3.  He has had no recurrences and we have not pursued long-term anticoagulation.  Continue low-dose aspirin and beta-blocker with observation.  2.  History of hyperthyroidism status post ablation and now on Synthroid with follow-up per PCP.  Current medicines were reviewed with the patient today.   Orders Placed This Encounter  Procedures  . EKG 12-Lead    Disposition: Follow-up in 1 year.  Signed, Satira Sark, MD, [facility] 04/20/2018 8:44 AM    [place] at [place], [place], [place] [address] Phone: [phone]; Fax: [phone]

## 2018-04-20 ENCOUNTER — Ambulatory Visit (INDEPENDENT_AMBULATORY_CARE_PROVIDER_SITE_OTHER): Payer: Medicare HMO | Admitting: Cardiology

## 2018-04-20 ENCOUNTER — Encounter: Payer: Self-pay | Admitting: Cardiology

## 2018-04-20 VITALS — BP 122/68 | HR 74 | Ht 66.0 in | Wt 130.0 lb

## 2018-04-20 DIAGNOSIS — Z8639 Personal history of other endocrine, nutritional and metabolic disease: Secondary | ICD-10-CM

## 2018-04-20 DIAGNOSIS — Z8679 Personal history of other diseases of the circulatory system: Secondary | ICD-10-CM | POA: Diagnosis not present

## 2018-04-20 NOTE — Patient Instructions (Signed)

## 2018-05-04 ENCOUNTER — Other Ambulatory Visit: Payer: Self-pay | Admitting: Cardiology

## 2019-05-16 ENCOUNTER — Ambulatory Visit (INDEPENDENT_AMBULATORY_CARE_PROVIDER_SITE_OTHER): Payer: Medicare HMO | Admitting: Cardiology

## 2019-05-16 ENCOUNTER — Other Ambulatory Visit: Payer: Self-pay

## 2019-05-16 ENCOUNTER — Encounter: Payer: Self-pay | Admitting: Cardiology

## 2019-05-16 VITALS — BP 99/58 | HR 66 | Temp 97.8°F | Ht 66.0 in | Wt 129.8 lb

## 2019-05-16 DIAGNOSIS — Z8679 Personal history of other diseases of the circulatory system: Secondary | ICD-10-CM | POA: Diagnosis not present

## 2019-05-16 DIAGNOSIS — Z8639 Personal history of other endocrine, nutritional and metabolic disease: Secondary | ICD-10-CM | POA: Diagnosis not present

## 2019-05-16 NOTE — Progress Notes (Signed)
Cardiology Office Note  Date: 05/16/2019   ID: Erika Holland, DOB 1941/06/15, MRN [id number]  PCP:  Berenice Primas  Cardiologist:  Rozann Lesches, MD Electrophysiologist:  None   Chief Complaint  Patient presents with  . Cardiac follow-up    History of Present Illness: Erika Holland is a 78 y.o. female last seen in August 2019.  She presents for a routine follow-up visit.  States that she has been doing well overall, still exercises regularly.  She enjoys walking, also rides a bicycle on the [place].  She does not report any significant exertional chest pain or progressive shortness of breath beyond NYHA class II.  I reviewed her medications.  She continues on low-dose Toprol-XL.  She mentions that when she forgot to take it for a few days she did feel a mild sense of palpitations.  Otherwise she has no symptoms.  I personally reviewed her ECG today which shows normal sinus rhythm with nonspecific T wave changes.  Past Medical History:  Diagnosis Date  . Arthritis   . History of atrial fibrillation    In setting of hyperthyroidism 2015  . Hyperthyroidism   . Type 2 diabetes mellitus ([place])     Past Surgical History:  Procedure Laterality Date  . Bandade surgery     ovarian cyst  . CHEILECTOMY  05/27/2012   Procedure: CHEILECTOMY;  Surgeon: Marcheta Grammes, DPM;  Location: [facility];  Service: Orthopedics;  Laterality: Right;  Cheilectomy right foot  . COLONOSCOPY  12/25/2011   Procedure: COLONOSCOPY;  Surgeon: Rogene Houston, MD;  Location: [facility];  Service: Endoscopy;  Laterality: N/A;  730    Current Outpatient Medications  Medication Sig Dispense Refill  . Acetaminophen (TYLENOL ARTHRITIS PAIN PO) Take by mouth as needed.     Marland Kitchen aspirin 81 MG tablet Take 81 mg by mouth daily.    . Calcium Carbonate (CALTRATE 600 PO) Take 1 tablet by mouth daily.     . cholecalciferol (VITAMIN D) 1000 UNITS tablet Take 1,000 Units by mouth daily.    . Garlic  (GARLIQUE) [id number] MG TBEC Take 1 tablet by mouth daily.    . Glucosamine-Chondroitin (MOVE FREE PO) Take 1 tablet by mouth daily.    Marland Kitchen levothyroxine (SYNTHROID, LEVOTHROID) 75 MCG tablet Take 75 mcg by mouth daily before breakfast.    . metoprolol succinate (TOPROL-XL) 25 MG 24 hr tablet TAKE 1 TABLET BY MOUTH DAILY 90 tablet 3  . Multiple Vitamin (MULTIVITAMIN WITH MINERALS) TABS Take 1 tablet by mouth daily.    . Multiple Vitamins-Minerals (IPRIFLAVONE OSTEO FORMULA) CAPS Take 1 capsule by mouth daily.    . Naphazoline HCl (AK-CON OP) Apply 1 drop to eye daily.     No current facility-administered medications for this visit.    Allergies:  Penicillins   Social History: The patient  reports that she quit smoking about 52 years ago. Her smoking use included cigarettes. She has a 1.25 pack-year smoking history. She quit smokeless tobacco use about 52 years ago. She reports current alcohol use. She reports that she does not use drugs.   ROS:  Please see the history of present illness. Otherwise, complete review of systems is positive for none.  All other systems are reviewed and negative.   Physical Exam: VS:  BP (!) 99/58   Pulse 66   Temp 97.8 F (36.6 C)   Ht 5\' 6"  (1.676 m)   Wt 129 lb 12.8 oz (58.9 kg)  SpO2 99%   BMI 20.95 kg/m , BMI Body mass index is 20.95 kg/m.  Wt Readings from Last 3 Encounters:  05/16/19 129 lb 12.8 oz (58.9 kg)  04/20/18 130 lb (59 kg)  04/16/17 131 lb (59.4 kg)    General: Patient appears comfortable at rest. HEENT: Conjunctiva and lids normal, wearing a mask. Neck: Supple, no elevated JVP or carotid bruits, no thyromegaly. Lungs: Clear to auscultation, nonlabored breathing at rest. Cardiac: Regular rate and rhythm, no S3 or significant systolic murmur, no pericardial rub. Abdomen: Soft, nontender, no hepatomegaly, bowel sounds present, no guarding or rebound. Extremities: No pitting edema, distal pulses 2+.  ECG:  An ECG dated 04/20/2018 was  personally reviewed today and demonstrated:  Sinus rhythm with nonspecific T wave changes.  Recent Labwork:  No interval lab work for review.  Other Studies Reviewed Today:  Echocardiogram 04/26/2014 [facility]): LVEF 60-65%, mild left atrial enlargement, mildly sclerotic aortic valve, RVSP 53 mmHg, no pericardial effusion.  Assessment and Plan:  1.  History of atrial fibrillation in 2015 in the setting of hyperthyroidism.  We are following her conservatively at this time.  She is on aspirin and Toprol-XL and has had no obvious recurrences.  ECG reviewed.  CHADSVASC score is 3, and if she does have recurring atrial fibrillation, anticoagulation can be discussed.  2.  History of hyperthyroidism status post ablation.  She is on Synthroid and follows at [facility] internal medicine.  Medication Adjustments/Labs and Tests Ordered: Current medicines are reviewed at length with the patient today.  Concerns regarding medicines are outlined above.   Tests Ordered: No orders of the defined types were placed in this encounter.   Medication Changes: No orders of the defined types were placed in this encounter.   Disposition:  Follow up 1 year in the [place] office.  Signed, Satira Sark, MD, [facility] 05/16/2019 11:39 AM    [place] at [place], [place], [place] [address] Phone: [phone]; Fax: [phone]

## 2019-05-16 NOTE — Patient Instructions (Signed)

## 2019-05-17 NOTE — Addendum Note (Signed)
Addended by: Laurine Blazer on: 05/17/2019 12:36 PM   Modules accepted: Orders

## 2019-06-26 ENCOUNTER — Other Ambulatory Visit: Payer: Self-pay | Admitting: Cardiology

## 2019-11-22 ENCOUNTER — Encounter (INDEPENDENT_AMBULATORY_CARE_PROVIDER_SITE_OTHER): Payer: Self-pay

## 2019-11-22 ENCOUNTER — Ambulatory Visit (INDEPENDENT_AMBULATORY_CARE_PROVIDER_SITE_OTHER): Payer: Medicare HMO | Admitting: Physician Assistant

## 2019-11-22 ENCOUNTER — Other Ambulatory Visit: Payer: Self-pay

## 2019-11-22 ENCOUNTER — Encounter: Payer: Self-pay | Admitting: Physician Assistant

## 2019-11-22 DIAGNOSIS — L570 Actinic keratosis: Secondary | ICD-10-CM | POA: Diagnosis not present

## 2019-11-22 DIAGNOSIS — Z1283 Encounter for screening for malignant neoplasm of skin: Secondary | ICD-10-CM

## 2019-11-22 MED ORDER — FLUOCINONIDE 0.05 % EX CREA: 1.0000 "application " | TOPICAL_CREAM | Freq: Two times a day (BID) | CUTANEOUS | 3 refills | Status: DC

## 2019-11-22 NOTE — Patient Instructions (Signed)

## 2019-11-22 NOTE — Progress Notes (Signed)
   Follow-Up Visit   Subjective  Erika Holland is a 79 y.o. female who presents for the following: Annual Exam (No new concerns).   Location: right thigh x 2 Duration: few months Quality: pink scale Associated Signs/Symptoms: none Modifyingstable Factors: persistent Severity: stable Timing:persistent  The following portions of the chart were reviewed this encounter and updated as appropriate: Tobacco  Allergies  Meds  Problems  Med Hx  Surg Hx  Fam Hx      Objective  Well appearing patient in no apparent distress; mood and affect are within normal limits.  A full examination was performed including scalp, head, eyes, ears, nose, lips, neck, chest, axillae, abdomen, back, buttocks, bilateral upper extremities, bilateral lower extremities, hands, feet, fingers, toes, fingernails, and toenails. All findings within normal limits unless otherwise noted below.  Objective  Right Thigh - Anterior, Right Thigh - inferior: Erythematous plaques with greasy scale.   Assessment & Plan  AK (actinic keratosis) (2) Right Thigh - Anterior; Right Thigh - inferior  Destruction of lesion - Right Thigh - Anterior, Right Thigh - inferior Complexity: simple   Destruction method: cryotherapy   Informed consent: discussed and consent obtained   Timeout:  patient name, date of birth, surgical site, and procedure verified Lesion destroyed using liquid nitrogen: Yes   Cryotherapy cycles:  1 Outcome: patient tolerated procedure well with no complications   Post-procedure details: wound care instructions given   No atypical nevi noted at the time of the visit.

## 2020-06-27 ENCOUNTER — Ambulatory Visit: Payer: Medicare HMO | Admitting: Cardiology

## 2020-07-02 NOTE — Progress Notes (Signed)
Cardiology Office Note  Date: 07/03/2020   ID: Erika Holland, DOB 03/17/1941, MRN [id number]  PCP:  Berenice Primas  Cardiologist:  Rozann Lesches, MD Electrophysiologist:  None   Chief Complaint: Follow-up history of atrial fibrillation  History of Present Illness: Erika Holland is a 79 y.o. female with a history of atrial fibrillation, hypothyroidism, DM type II.  Last encounter with Dr. Domenic Polite 05/16/2019.  She was doing well overall and still exercising regularly.  She did not report any significant exertional chest pain or progressive shortness of breath beyond NYHA class II.  She continued on low-dose Toprol.  History of atrial fibrillation occurred in the setting of hyperthyroidism in 2015.  She was being treated conservatively.  She was continuing aspirin and Toprol with no obvious recurrences.  CHA2DS2-VASc score of 3.  If recurrent symptoms, anticoagulation could be discussed.  She is on Synthroid and follows at [facility] internal medicine.  She is here today for 1 year follow-up.  She denies any recent acute illnesses or hospitalizations.  She goes to the [facility] daily except for Sundays to exercise and has been doing so for quite some time.  She denies any recent anginal or exertional symptoms.  Has occasional short lived palpitations.  Today she is currently in a normal sinus rhythm with a rate of 69 with occasional PAC.  She denies any orthostatic symptoms, CVA or TIA-like symptoms, dyspnea or shortness of breath, PND, orthopnea, bleeding, lower extremity edema.   Past Medical History:  Diagnosis Date  . Arthritis   . Basal cell carcinoma 03/21/2005   left temple - tx CX3 + 5FU  . History of atrial fibrillation    In setting of hyperthyroidism 2015  . Hyperthyroidism   . Type 2 diabetes mellitus ([place])     Past Surgical History:  Procedure Laterality Date  . Bandade surgery     ovarian cyst  . CHEILECTOMY  05/27/2012   Procedure: CHEILECTOMY;  Surgeon: Marcheta Grammes, DPM;  Location: [facility];  Service: Orthopedics;  Laterality: Right;  Cheilectomy right foot  . COLONOSCOPY  12/25/2011   Procedure: COLONOSCOPY;  Surgeon: Rogene Houston, MD;  [facility];  Service: Endoscopy;  Laterality: N/A;  730    Current Outpatient Medications  Medication Sig Dispense Refill  . Acetaminophen (TYLENOL ARTHRITIS PAIN PO) Take by mouth as needed.     Marland Kitchen aspirin 81 MG tablet Take 81 mg by mouth daily.    . Calcium Carbonate (CALTRATE 600 PO) Take 1 tablet by mouth daily.     . cholecalciferol (VITAMIN D) 1000 UNITS tablet Take 1,000 Units by mouth daily.    . Garlic (GARLIQUE) 782 MG TBEC Take 1 tablet by mouth daily.    . Glucosamine-Chondroitin (MOVE FREE PO) Take 1 tablet by mouth daily.    Marland Kitchen levothyroxine (SYNTHROID, LEVOTHROID) 75 MCG tablet Take 75 mcg by mouth daily before breakfast.    . metoprolol succinate (TOPROL-XL) 25 MG 24 hr tablet TAKE 1 TABLET BY MOUTH DAILY 90 tablet 3  . Multiple Vitamin (MULTIVITAMIN WITH MINERALS) TABS Take 1 tablet by mouth daily.    . Multiple Vitamins-Minerals (IPRIFLAVONE OSTEO FORMULA) CAPS Take 1 capsule by mouth daily.    . Naphazoline HCl (AK-CON OP) Apply 1 drop to eye daily.     No current facility-administered medications for this visit.   Allergies:  Penicillins   Social History: The patient  reports that she quit smoking about 53 years ago. Her  smoking use included cigarettes. She has a 1.25 pack-year smoking history. She quit smokeless tobacco use about 53 years ago. She reports current alcohol use. She reports that she does not use drugs.   Family History: The patient's family history includes Diabetes in her mother; Healthy in her sister; Heart attack in her brother; Other in her father.   ROS:  Please see the history of present illness. Otherwise, complete review of systems is positive for none.  All other systems are reviewed and negative.   Physical Exam: VS:  BP 132/70   Pulse 61   Ht  5\' 6"  (1.676 m)   Wt 123 lb 12.8 oz (56.2 kg)   SpO2 98%   BMI 19.98 kg/m , BMI Body mass index is 19.98 kg/m.  Wt Readings from Last 3 Encounters:  07/03/20 123 lb 12.8 oz (56.2 kg)  05/16/19 129 lb 12.8 oz (58.9 kg)  04/20/18 130 lb (59 kg)    General: Patient appears comfortable at rest. Neck: Supple, no elevated JVP or carotid bruits, no thyromegaly. Lungs: Clear to auscultation, nonlabored breathing at rest. Cardiac: Regular rate and rhythm, no S3 or significant systolic murmur, no pericardial rub. Extremities: No pitting edema, distal pulses 2+. Skin: Warm and dry. Musculoskeletal: No kyphosis. Neuropsychiatric: Alert and oriented x3, affect grossly appropriate.  ECG:  An ECG dated 07/03/2020 was personally reviewed today and demonstrated:  Sinus rhythm with premature atrial complexes rate of 69, nonspecific ST and T wave abnormality.  Recent Labwork: No results found for requested labs within last 8760 hours.  No results found for: CHOL, TRIG, HDL, CHOLHDL, VLDL, LDLCALC, LDLDIRECT  Other Studies Reviewed Today:   Assessment and Plan:  1. History of atrial fibrillation   2. History of hypothyroidism    1. History of atrial fibrillation EKG today shows normal sinus rhythm with premature atrial complexes rate of 69.  Nonspecific ST and T wave abnormalities.  Continue Toprol-XL 25 mg daily, aspirin 81 mg daily.  2. History of hypothyroidism Continues on levothyroxine 75 mcg p.o. daily.  States her recent thyroid levels were within normal limits at PCP office.  She states she will have an annual wellness visit and have other labs drawn as well the near future.  Medication Adjustments/Labs and Tests Ordered: Current medicines are reviewed at length with the patient today.  Concerns regarding medicines are outlined above.   Disposition: Follow-up with Dr. Domenic Polite or [facility] 1 year.  Signed, Levell July, NP 07/03/2020 8:43 AM    [facility] at  [place], [place], [place] [address] Phone: [phone]; Fax: [phone]

## 2020-07-03 ENCOUNTER — Encounter: Payer: Self-pay | Admitting: Family Medicine

## 2020-07-03 ENCOUNTER — Ambulatory Visit (INDEPENDENT_AMBULATORY_CARE_PROVIDER_SITE_OTHER): Payer: Medicare HMO | Admitting: Family Medicine

## 2020-07-03 VITALS — BP 132/70 | HR 61 | Ht 66.0 in | Wt 123.8 lb

## 2020-07-03 DIAGNOSIS — Z8639 Personal history of other endocrine, nutritional and metabolic disease: Secondary | ICD-10-CM

## 2020-07-03 DIAGNOSIS — Z8679 Personal history of other diseases of the circulatory system: Secondary | ICD-10-CM | POA: Diagnosis not present

## 2020-07-03 NOTE — Patient Instructions (Addendum)

## 2020-07-03 NOTE — Addendum Note (Signed)
Addended by: Laurine Blazer on: 07/03/2020 11:06 AM   Modules accepted: Orders

## 2020-08-21 ENCOUNTER — Other Ambulatory Visit: Payer: Self-pay | Admitting: Cardiology

## 2021-09-17 NOTE — Progress Notes (Signed)
Cardiology Office Note  Date: 09/18/2021   ID: Erika Holland, DOB Jan 09, 1941, MRN [id number]  PCP:  Erika Holland  Cardiologist:  Erika Lesches, MD Electrophysiologist:  None   Chief Complaint  Patient presents with   Cardiac follow-up    History of Present Illness: Erika Holland is a 81 y.o. female last seen in November 2021 by Mr. Erika Sake NP.  She is here for a routine visit.  Reports no progressive palpitations or chest discomfort, stays active with ADLs and exercise.  She enjoys walking on a daily basis, either goes to the [facility] or walks outdoors.  She walks for 20 to 30 minutes at a time and also does weight workouts, upper body and lower body.  I personally reviewed her ECG today which shows normal sinus rhythm.  She continues to follow at [facility], does not report any major health changes in the interim.  Past Medical History:  Diagnosis Date   Arthritis    Basal cell carcinoma 03/21/2005   left temple - tx CX3 + 5FU   History of atrial fibrillation    In setting of hyperthyroidism 2015   Hyperthyroidism    Type 2 diabetes mellitus ([place])     Past Surgical History:  Procedure Laterality Date   Bandade surgery     ovarian cyst   CHEILECTOMY  05/27/2012   Procedure: CHEILECTOMY;  Surgeon: Erika Holland, DPM;  Location: [facility];  Service: Orthopedics;  Laterality: Right;  Cheilectomy right foot   COLONOSCOPY  12/25/2011   Procedure: COLONOSCOPY;  Surgeon: Erika Houston, MD;  Location: [facility];  Service: Endoscopy;  Laterality: N/A;  730    Current Outpatient Medications  Medication Sig Dispense Refill   Acetaminophen (TYLENOL ARTHRITIS PAIN PO) Take by mouth as needed.      aspirin 81 MG tablet Take 81 mg by mouth daily.     Calcium Carbonate (CALTRATE 600 PO) Take 1 tablet by mouth daily.      cholecalciferol (VITAMIN D) 1000 UNITS tablet Take 1,000 Units by mouth daily.     Garlic 782 MG TBEC Take 1 tablet by mouth daily.      Glucosamine-Chondroitin (MOVE FREE PO) Take 1 tablet by mouth daily.     levothyroxine (SYNTHROID, LEVOTHROID) 75 MCG tablet Take 75 mcg by mouth daily before breakfast.     metoprolol succinate (TOPROL-XL) 25 MG 24 hr tablet TAKE 1 TABLET BY MOUTH DAILY 90 tablet 3   Multiple Vitamin (MULTIVITAMIN WITH MINERALS) TABS Take 1 tablet by mouth daily.     Multiple Vitamins-Minerals (IPRIFLAVONE OSTEO FORMULA) CAPS Take 1 capsule by mouth daily.     Naphazoline HCl (AK-CON OP) Apply 1 drop to eye daily.     No current facility-administered medications for this visit.   Allergies:  Penicillins   ROS: No syncope.  Physical Exam: VS:  BP 114/60    Pulse 72    Ht 5\' 6"  (1.676 m)    Wt 121 lb 12.8 oz (55.2 kg)    SpO2 98%    BMI 19.66 kg/m , BMI Body mass index is 19.66 kg/m.  Wt Readings from Last 3 Encounters:  09/18/21 121 lb 12.8 oz (55.2 kg)  07/03/20 123 lb 12.8 oz (56.2 kg)  05/16/19 129 lb 12.8 oz (58.9 kg)    General: Patient appears comfortable at rest. HEENT: Conjunctiva and lids normal, wearing a mask. Neck: Supple, no elevated JVP or carotid bruits, no thyromegaly. Lungs: Clear  to auscultation, nonlabored breathing at rest. Cardiac: Regular rate and rhythm, no S3 or significant systolic murmur, no pericardial rub.  ECG:  An ECG dated 01/08/2020 was personally reviewed today and demonstrated:  Sinus rhythm with PACs and nonspecific ST-T changes.  Recent Labwork:  July 2020: Cholesterol 202, triglycerides 96, HDL 69, LDL 114 September 2022: Hemoglobin 12.3, platelets 288, BUN 15, creatinine 0.93, potassium 4.0  Other Studies Reviewed Today:  Echocardiogram 04/26/2014 [facility]): LVEF 60-65%, mild left atrial enlargement, mildly sclerotic aortic valve, RVSP 53 mmHg, no pericardial effusion.  Assessment and Plan:  History of atrial fibrillation back in 2015 in the setting of hyperthyroidism.  She has had no obvious recurrences with regular follow-up over time.  ECG is normal  today.  Continue aspirin and Toprol-XL as before.  We have discussed warning signs and symptoms that would prompt further cardiac monitoring.  If she were to have recurrent atrial fibrillation, anticoagulation would be considered given CHA2DS2-VASc score of 4.  Medication Adjustments/Labs and Tests Ordered: Current medicines are reviewed at length with the patient today.  Concerns regarding medicines are outlined above.   Tests Ordered: Orders Placed This Encounter  Procedures   EKG 12-Lead    Medication Changes: No orders of the defined types were placed in this encounter.   Disposition:  Follow up  1 year.  Signed, Erika Sark, MD, [facility] 09/18/2021 8:36 AM    [facility] at [place], [place], [place] [address] Phone: [phone]; Fax: [phone]

## 2021-09-18 ENCOUNTER — Encounter: Payer: Self-pay | Admitting: Cardiology

## 2021-09-18 ENCOUNTER — Ambulatory Visit (INDEPENDENT_AMBULATORY_CARE_PROVIDER_SITE_OTHER): Payer: Medicare HMO | Admitting: Cardiology

## 2021-09-18 VITALS — BP 114/60 | HR 72 | Ht 66.0 in | Wt 121.8 lb

## 2021-09-18 DIAGNOSIS — Z8679 Personal history of other diseases of the circulatory system: Secondary | ICD-10-CM

## 2021-09-18 NOTE — Patient Instructions (Addendum)

## 2021-11-07 ENCOUNTER — Encounter: Payer: Self-pay | Admitting: Physician Assistant

## 2021-11-07 ENCOUNTER — Ambulatory Visit (INDEPENDENT_AMBULATORY_CARE_PROVIDER_SITE_OTHER): Payer: Medicare HMO | Admitting: Physician Assistant

## 2021-11-07 ENCOUNTER — Other Ambulatory Visit: Payer: Self-pay

## 2021-11-07 DIAGNOSIS — Z1283 Encounter for screening for malignant neoplasm of skin: Secondary | ICD-10-CM | POA: Diagnosis not present

## 2021-11-07 DIAGNOSIS — Z85828 Personal history of other malignant neoplasm of skin: Secondary | ICD-10-CM

## 2021-11-07 DIAGNOSIS — L570 Actinic keratosis: Secondary | ICD-10-CM | POA: Diagnosis not present

## 2021-11-07 NOTE — Progress Notes (Signed)
? ?  Follow-Up Visit ?  ?Subjective  ?Erika Holland is a 81 y.o. female who presents for the following: Annual Exam (Personal history of bcc and scc with Dr Tonia Brooms. ). ? ? ?The following portions of the chart were reviewed this encounter and updated as appropriate:  Tobacco  Allergies  Meds  Problems  Med Hx  Surg Hx  Fam Hx   ?  ? ?Objective  ?Well appearing patient in no apparent distress; mood and affect are within normal limits. ? ?A full examination was performed including scalp, head, eyes, ears, nose, lips, neck, chest, axillae, abdomen, back, buttocks, bilateral upper extremities, bilateral lower extremities, hands, feet, fingers, toes, fingernails, and toenails. All findings within normal limits unless otherwise noted below. ? ?head to toe ?No atypical nevi No signs of non-mole skin cancer.  ? ?Left Temporal Scalp ?Scar clear ? ?Right Lower Leg - Posterior ?Dyspigmented scars clear. ? ?Left Buccal Cheek, Mid Forehead, Right Anterior Neck (4) ?Erythematous patches with gritty scale. ? ? ?Assessment & Plan  ?Encounter for screening for malignant neoplasm of skin ?head to toe ? ?Yearly skin examination ? ?History of basal cell carcinoma (BCC) ?Left Temporal Scalp ? ?observe ? ?History of SCC (squamous cell carcinoma) of skin ?Right Lower Leg - Posterior ? ?Observe ? ?AK (actinic keratosis) (6) ?Mid Forehead; Left Buccal Cheek; Right Anterior Neck (4) ? ?Destruction of lesion - Left Buccal Cheek, Mid Forehead, Right Anterior Neck ?Complexity: simple   ?Destruction method: cryotherapy   ?Informed consent: discussed and consent obtained   ?Timeout:  patient name, date of birth, surgical site, and procedure verified ?Lesion destroyed using liquid nitrogen: Yes   ?Cryotherapy cycles:  3 ?Outcome: patient tolerated procedure well with no complications   ?Post-procedure details: wound care instructions given   ? ?No atypical nevi noted at the time of the visit. ? ?I, Genea Rheaume, PA-C, have reviewed all  documentation's for this visit.  The documentation on 11/07/21 for the exam, diagnosis, procedures and orders are all accurate and complete. ?

## 2021-12-17 ENCOUNTER — Encounter (INDEPENDENT_AMBULATORY_CARE_PROVIDER_SITE_OTHER): Payer: Self-pay | Admitting: *Deleted

## 2022-03-06 ENCOUNTER — Ambulatory Visit: Payer: Medicare HMO | Admitting: Physician Assistant

## 2022-04-09 ENCOUNTER — Other Ambulatory Visit: Payer: Self-pay | Admitting: Nurse Practitioner

## 2022-04-09 DIAGNOSIS — Z1231 Encounter for screening mammogram for malignant neoplasm of breast: Secondary | ICD-10-CM

## 2022-05-14 ENCOUNTER — Ambulatory Visit
Admission: RE | Admit: 2022-05-14 | Discharge: 2022-05-14 | Disposition: A | Discharge status: Home / Self Care | Payer: Medicare HMO | Source: Ambulatory Visit | Attending: Nurse Practitioner | Admitting: Nurse Practitioner

## 2022-05-14 DIAGNOSIS — Z1231 Encounter for screening mammogram for malignant neoplasm of breast: Secondary | ICD-10-CM

## 2022-10-08 ENCOUNTER — Ambulatory Visit (INDEPENDENT_AMBULATORY_CARE_PROVIDER_SITE_OTHER): Payer: Medicare HMO

## 2022-10-08 ENCOUNTER — Other Ambulatory Visit: Payer: Self-pay | Admitting: Cardiology

## 2022-10-08 ENCOUNTER — Telehealth: Payer: Self-pay | Admitting: Cardiology

## 2022-10-08 ENCOUNTER — Ambulatory Visit: Payer: Medicare HMO | Attending: Cardiology | Admitting: Cardiology

## 2022-10-08 ENCOUNTER — Encounter: Payer: Self-pay | Admitting: Cardiology

## 2022-10-08 VITALS — BP 116/68 | HR 62 | Ht 66.0 in | Wt 126.0 lb

## 2022-10-08 DIAGNOSIS — Z8679 Personal history of other diseases of the circulatory system: Secondary | ICD-10-CM

## 2022-10-08 DIAGNOSIS — I4891 Unspecified atrial fibrillation: Secondary | ICD-10-CM

## 2022-10-08 NOTE — Telephone Encounter (Signed)
7 Day ZIO XT PERCERT

## 2022-10-08 NOTE — Progress Notes (Signed)
Cardiology Office Note  Date: 10/08/2022   ID: GALILEE PIERRON, DOB 01-Oct-1940, MRN [id number]  PCP:  Berenice Primas  Cardiologist:  Rozann Lesches, MD Electrophysiologist:  None   Chief Complaint  Patient presents with   Cardiac follow-up    History of Present Illness: Erika Holland is a 82 y.o. female last seen in January 2023.  She is here for a routine visit.  Does not recall any major sense of palpitations or breathlessness since last encounter.  States that she has had some trouble with her memory.  She is enjoying retirement, tries to keep yourself busy.  Does not indicate any other obvious health changes.  I reviewed her medications which are noted below.  She is on both low-dose Toprol-XL and aspirin.  I talked with her today about a screening ZIO monitor to make sure that she is not having any asymptomatic atrial fibrillation that would prompt further treatment.  This was documented in years past in the setting of hyperthyroidism and she has not been anticoagulated, however at this point her CHA2DS2-VASc score is 4.  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      aspirin 81 MG tablet Take 81 mg by mouth daily.     Calcium Carbonate (CALTRATE 600 PO) Take 1 tablet by mouth daily.      cholecalciferol (VITAMIN D) 1000 UNITS tablet Take 1,000 Units by mouth daily.     Garlic 342 MG TBEC Take 1 tablet by mouth daily.     Glucosamine-Chondroitin (MOVE FREE PO) Take 1 tablet by mouth daily.     levothyroxine (SYNTHROID, LEVOTHROID) 75 MCG tablet Take 75 mcg  by mouth daily before breakfast.     metoprolol succinate (TOPROL-XL) 25 MG 24 hr tablet TAKE 1 TABLET BY MOUTH DAILY 90 tablet 3   Multiple Vitamin (MULTIVITAMIN WITH MINERALS) TABS Take 1 tablet by mouth daily.     Multiple Vitamins-Minerals (IPRIFLAVONE OSTEO FORMULA) CAPS Take 1 capsule by mouth daily.     Naphazoline HCl (AK-CON OP) Apply 1 drop to eye daily.     No current facility-administered medications for this visit.   Allergies:  Penicillins   ROS: No syncope.  Physical Exam: VS:  BP 116/68   Pulse 62   Ht '5\' 6"'$  (1.676 m)   Wt 126 lb (57.2 kg)   BMI 20.34 kg/m , BMI Body mass index is 20.34 kg/m.  Wt Readings from Last 3 Encounters:  10/08/22 126 lb (57.2 kg)  09/18/21 121 lb 12.8 oz (55.2 kg)  07/03/20 123 lb 12.8 oz (56.2 kg)    General: Patient appears comfortable at rest. HEENT: Conjunctiva and lids normal. Neck: Supple, no elevated JVP or carotid bruits. Lungs: Clear to auscultation, nonlabored breathing at rest. Cardiac: Regular rate and rhythm, no S3 or significant systolic murmur. Extremities: No pitting edema.  ECG:  An ECG dated 09/18/2021 was personally reviewed today and demonstrated:  Sinus rhythm.  Recent Labwork:  May 2023: Hemoglobin 14.1, platelets 299,  BUN 17, creatinine 0.71, potassium 4.2, AST 33, ALT 24, cholesterol 185, triglycerides 99, HDL 60, LDL 107, hemoglobin A1c 5.5%, TSH 2.31  Other Studies Reviewed Today:  No interval cardiac testing for review today.  Assessment and Plan:  History of atrial fibrillation in the setting of hyperthyroidism years back.  She has had no definite recurrences, we discussed a screening ZIO monitor to make sure that she is not having asymptomatic atrial fibrillation that would require additional treatment.  For now she is on aspirin and Toprol-XL.  Her CHA2DS2-VASc score is 4, and if we do documented atrial fibrillation, she would be a candidate for anticoagulation instead of aspirin.  Medication  Adjustments/Labs and Tests Ordered: Current medicines are reviewed at length with the patient today.  Concerns regarding medicines are outlined above.   Tests Ordered: No orders of the defined types were placed in this encounter.   Medication Changes: No orders of the defined types were placed in this encounter.   Disposition:  Follow up  1 year.  Signed, Satira Sark, MD, [facility] 10/08/2022 11:07 AM    [facility] [place], [place], [place] [address] Phone: [phone]; Fax: [phone]

## 2022-10-08 NOTE — Patient Instructions (Addendum)
Medication Instructions:  Your physician recommends that you continue on your current medications as directed. Please refer to the Current Medication list given to you today.  Labwork: none  Testing/Procedures: Your physician has recommended that you wear a Zio monitor.   This monitor is a medical device that records the heart's electrical activity. Doctors most often use these monitors to diagnose arrhythmias. Arrhythmias are problems with the speed or rhythm of the heartbeat. The monitor is a small device applied to your chest. You can wear one while you do your normal daily activities. While wearing this monitor if you have any symptoms to push the button and record what you felt. Once you have worn this monitor for the period of time provider prescribed (for 14 days), you will return the monitor device in the postage paid box. Once it is returned they will download the data collected and provide [place] with a report which the provider will then review and we will call you with those results. Important tips:  Avoid showering during the first 24 hours of wearing the monitor. Avoid excessive sweating to help maximize wear time. Do not submerge the device, no hot tubs, and no swimming pools. Keep any lotions or oils away from the patch. After 24 hours you may shower with the patch on. Take brief showers with your back facing the shower head.  Do not remove patch once it has been placed because that will interrupt data and decrease adhesive wear time. Push the button when you have any symptoms and write down what you were feeling. Once you have completed wearing your monitor, remove and place into box which has postage paid and place in your outgoing mailbox.  If for some reason you have misplaced your box then call our office and we can provide another box and/or mail it off for you.  Follow-Up: Your physician recommends that you schedule a follow-up appointment in: 1 year. You will receive a  reminder call in the mail in about 10 months reminding you to call and schedule your appointment. If you don't receive this call, please contact our office.  Any Other Special Instructions Will Be Listed Below (If Applicable).  If you need a refill on your cardiac medications before your next appointment, please call your pharmacy.

## 2022-11-11 ENCOUNTER — Ambulatory Visit: Payer: Medicare HMO | Admitting: Physician Assistant

## 2023-04-30 ENCOUNTER — Other Ambulatory Visit: Payer: Self-pay | Admitting: Nurse Practitioner

## 2023-04-30 DIAGNOSIS — Z1231 Encounter for screening mammogram for malignant neoplasm of breast: Secondary | ICD-10-CM

## 2023-06-09 ENCOUNTER — Ambulatory Visit
Admission: RE | Admit: 2023-06-09 | Discharge: 2023-06-09 | Disposition: A | Discharge status: Home / Self Care | Payer: Medicare HMO | Source: Ambulatory Visit | Attending: Nurse Practitioner | Admitting: Nurse Practitioner

## 2023-06-09 DIAGNOSIS — Z1231 Encounter for screening mammogram for malignant neoplasm of breast: Secondary | ICD-10-CM

## 2023-12-23 ENCOUNTER — Encounter: Payer: Self-pay | Admitting: Cardiology

## 2023-12-23 ENCOUNTER — Ambulatory Visit: Payer: Medicare HMO | Attending: Cardiology | Admitting: Cardiology

## 2023-12-23 VITALS — BP 118/62 | HR 70 | Ht 66.0 in | Wt 125.2 lb

## 2023-12-23 DIAGNOSIS — I4891 Unspecified atrial fibrillation: Secondary | ICD-10-CM

## 2023-12-23 DIAGNOSIS — Z8679 Personal history of other diseases of the circulatory system: Secondary | ICD-10-CM | POA: Diagnosis not present

## 2023-12-23 NOTE — Patient Instructions (Signed)
 Medication Instructions:  Your physician recommends that you continue on your current medications as directed. Please refer to the Current Medication list given to you today.  *If you need a refill on your cardiac medications before your next appointment, please call your pharmacy*  Lab Work: NONE   If you have labs (blood work) drawn today and your tests are completely normal, you will receive your results only by: MyChart Message (if you have MyChart) OR A paper copy in the mail If you have any lab test that is abnormal or we need to change your treatment, we will call you to review the results.  Testing/Procedures: NONE   Follow-Up: At [facility], you and your health needs are our priority.  As part of our continuing mission to provide you with exceptional heart care, our providers are all part of one team.  This team includes your primary Cardiologist (physician) and Advanced Practice Providers or APPs (Physician Assistants and Nurse Practitioners) who all work together to provide you with the care you need, when you need it.  Your next appointment:   1 year(s)  Provider:   You may see Teddie Favre, MD or the following Advanced Practice Provider on your designated Care Team:   Lasalle Pointer, NP    We recommend signing up for the patient portal called "MyChart".  Sign up information is provided on this After Visit Summary.  MyChart is used to connect with patients for Virtual Visits (Telemedicine).  Patients are able to view lab/test results, encounter notes, upcoming appointments, etc.  Non-urgent messages can be sent to your provider as well.   To learn more about what you can do with MyChart, go to [email or web address].   Other Instructions Thank you for choosing [facility]!

## 2023-12-23 NOTE — Progress Notes (Signed)
    Cardiology Office Note  Date: 12/23/2023   ID: Erika Holland, DOB [birth year], MRN [id number]  History of Present Illness: Erika Holland is an 83 y.o. female last seen in February 2024.  She is here for a routine visit.  Reports no palpitations or exertional chest pain, no sudden dizziness or syncope.  She tries to exercise every day of the week except Sunday.  Has had some right hip pain that limited her for period of time, however back to walking and using light weights.  We went over her medications.  No changes in current regimen.  She continues to follow with PCP.  I reviewed her ECG today which shows sinus rhythm with minimal criteria for LVH.  Physical Exam: VS:  BP 118/62   Pulse 70   Ht 5\' 6"  (1.676 m)   Wt 125 lb 3.2 oz (56.8 kg)   SpO2 99%   BMI 20.21 kg/m , BMI Body mass index is 20.21 kg/m.  Wt Readings from Last 3 Encounters:  12/23/23 125 lb 3.2 oz (56.8 kg)  10/08/22 126 lb (57.2 kg)  09/18/21 121 lb 12.8 oz (55.2 kg)    General: Patient appears comfortable at rest. HEENT: Conjunctiva and lids normal. Neck: Supple, no elevated JVP or carotid bruits. Lungs: Clear to auscultation, nonlabored breathing at rest. Cardiac: Regular rate and rhythm, no S3 or significant systolic murmur.  ECG:  An ECG dated 09/18/2021 was personally reviewed today and demonstrated:  Sinus rhythm.  Labwork:  June 2024: Hemoglobin 13.2, platelets 272, BUN 18, creatinine 0.78, potassium 4.3, AST 26, ALT 23, cholesterol 183, triglycerides 100, HDL 65, LDL 100, TSH 4.17  Other Studies Reviewed Today:  No interval cardiac testing for review today.  Assessment and Plan:  History of atrial fibrillation in the setting of hyperthyroidism years ago.  Follow-up screening ZIO monitor in February 2024 did not demonstrate any obvious recurring atrial fibrillation, some brief episodes of PSVT.  She continues on Toprol -XL 25 mg daily.  ECG reviewed.  Continue observation for  now.  Disposition:  Follow up  1 year.  Signed, Gerard Knight, M.D., F.A.C.C. [facility]

## 2024-06-27 ENCOUNTER — Other Ambulatory Visit: Payer: Self-pay | Admitting: Nurse Practitioner

## 2024-06-27 DIAGNOSIS — Z1231 Encounter for screening mammogram for malignant neoplasm of breast: Secondary | ICD-10-CM

## 2024-07-05 ENCOUNTER — Ambulatory Visit

## 2024-07-05 ENCOUNTER — Ambulatory Visit
Admission: RE | Admit: 2024-07-05 | Discharge: 2024-07-05 | Disposition: A | Discharge status: Home / Self Care | Source: Ambulatory Visit | Attending: Nurse Practitioner | Admitting: Nurse Practitioner

## 2024-07-05 DIAGNOSIS — Z1231 Encounter for screening mammogram for malignant neoplasm of breast: Secondary | ICD-10-CM
